# Patient Record
Sex: Female | Born: 1955 | Race: White | Hispanic: No | Marital: Married | Smoking: Never smoker
Health system: Southern US, Community
[De-identification: ages and names within clinical notes are randomized; demographics above are authoritative.]

## PROBLEM LIST (undated history)

## (undated) DIAGNOSIS — E538 Deficiency of other specified B group vitamins: Secondary | ICD-10-CM

## (undated) DIAGNOSIS — K219 Gastro-esophageal reflux disease without esophagitis: Secondary | ICD-10-CM

## (undated) DIAGNOSIS — H269 Unspecified cataract: Secondary | ICD-10-CM

## (undated) DIAGNOSIS — R112 Nausea with vomiting, unspecified: Secondary | ICD-10-CM

## (undated) DIAGNOSIS — E871 Hypo-osmolality and hyponatremia: Secondary | ICD-10-CM

## (undated) DIAGNOSIS — C4490 Unspecified malignant neoplasm of skin, unspecified: Secondary | ICD-10-CM

## (undated) DIAGNOSIS — R2689 Other abnormalities of gait and mobility: Secondary | ICD-10-CM

## (undated) DIAGNOSIS — E2740 Unspecified adrenocortical insufficiency: Secondary | ICD-10-CM

## (undated) DIAGNOSIS — M1990 Unspecified osteoarthritis, unspecified site: Secondary | ICD-10-CM

## (undated) DIAGNOSIS — F4321 Adjustment disorder with depressed mood: Secondary | ICD-10-CM

## (undated) DIAGNOSIS — Z9889 Other specified postprocedural states: Secondary | ICD-10-CM

## (undated) HISTORY — DX: Unspecified adrenocortical insufficiency: E27.40

## (undated) HISTORY — DX: Unspecified cataract: H26.9

## (undated) HISTORY — DX: Unspecified osteoarthritis, unspecified site: M19.90

## (undated) HISTORY — PX: EYE SURGERY: SHX253

## (undated) HISTORY — DX: Unspecified malignant neoplasm of skin, unspecified: C44.90

## (undated) HISTORY — DX: Adjustment disorder with depressed mood: F43.21

## (undated) HISTORY — DX: Hypo-osmolality and hyponatremia: E87.1

## (undated) HISTORY — PX: WRIST SURGERY: SHX841

## (undated) HISTORY — DX: Gastro-esophageal reflux disease without esophagitis: K21.9

## (undated) HISTORY — DX: Other abnormalities of gait and mobility: R26.89

## (undated) HISTORY — PX: APPENDECTOMY: SHX54

## (undated) HISTORY — PX: CHOLECYSTECTOMY: SHX55

## (undated) HISTORY — DX: Deficiency of other specified B group vitamins: E53.8

## (undated) HISTORY — PX: ABDOMINAL HYSTERECTOMY: SHX81

---

## 1999-03-24 ENCOUNTER — Encounter: Admission: RE | Admit: 1999-03-24 | Discharge: 1999-04-15 | Payer: Self-pay | Admitting: Neurological Surgery

## 1999-12-15 ENCOUNTER — Other Ambulatory Visit: Admission: RE | Admit: 1999-12-15 | Discharge: 1999-12-15 | Payer: Self-pay | Admitting: Obstetrics and Gynecology

## 2000-04-13 ENCOUNTER — Emergency Department (HOSPITAL_COMMUNITY): Admission: EM | Admit: 2000-04-13 | Discharge: 2000-04-13 | Payer: Self-pay | Admitting: Emergency Medicine

## 2000-04-13 ENCOUNTER — Encounter: Payer: Self-pay | Admitting: Emergency Medicine

## 2003-08-16 ENCOUNTER — Ambulatory Visit (HOSPITAL_COMMUNITY): Admission: RE | Admit: 2003-08-16 | Discharge: 2003-08-16 | Payer: Self-pay | Admitting: Orthopedic Surgery

## 2006-02-15 DIAGNOSIS — E871 Hypo-osmolality and hyponatremia: Secondary | ICD-10-CM

## 2006-02-15 DIAGNOSIS — R2689 Other abnormalities of gait and mobility: Secondary | ICD-10-CM

## 2006-02-15 HISTORY — DX: Hypo-osmolality and hyponatremia: E87.1

## 2006-02-15 HISTORY — DX: Other abnormalities of gait and mobility: R26.89

## 2006-04-17 ENCOUNTER — Ambulatory Visit: Payer: Self-pay | Admitting: Vascular Surgery

## 2006-04-17 ENCOUNTER — Emergency Department (HOSPITAL_COMMUNITY): Admission: EM | Admit: 2006-04-17 | Discharge: 2006-04-17 | Payer: Self-pay | Admitting: Emergency Medicine

## 2006-04-26 ENCOUNTER — Ambulatory Visit: Payer: Self-pay | Admitting: Internal Medicine

## 2006-05-26 ENCOUNTER — Ambulatory Visit: Payer: Self-pay | Admitting: Internal Medicine

## 2006-05-26 LAB — CONVERTED CEMR LAB
BUN: 8 mg/dL (ref 6–23)
CO2: 26 meq/L (ref 19–32)
Calcium: 8.8 mg/dL (ref 8.4–10.5)
GFR calc Af Amer: 97 mL/min
GFR calc non Af Amer: 80 mL/min
Potassium: 4 meq/L (ref 3.5–5.1)
Sed Rate: 9 mm/hr (ref 0–25)
TSH: 1.5 microintl units/mL (ref 0.35–5.50)
Uric Acid, Serum: 4.8 mg/dL (ref 2.4–7.0)

## 2006-06-24 ENCOUNTER — Inpatient Hospital Stay (HOSPITAL_COMMUNITY): Admission: AD | Admit: 2006-06-24 | Discharge: 2006-06-29 | Payer: Self-pay | Admitting: Internal Medicine

## 2006-06-24 ENCOUNTER — Ambulatory Visit: Payer: Self-pay | Admitting: Internal Medicine

## 2006-06-29 ENCOUNTER — Ambulatory Visit: Payer: Self-pay | Admitting: Internal Medicine

## 2006-07-06 ENCOUNTER — Ambulatory Visit: Payer: Self-pay | Admitting: Endocrinology

## 2006-07-06 ENCOUNTER — Ambulatory Visit: Payer: Self-pay | Admitting: Internal Medicine

## 2006-07-06 LAB — CONVERTED CEMR LAB
Beta Globulin: 5.4 % (ref 4.7–7.2)
CO2: 27 meq/L (ref 19–32)
Calcium: 9.1 mg/dL (ref 8.4–10.5)
GFR calc Af Amer: 97 mL/min
GFR calc non Af Amer: 80 mL/min
Gamma Globulin: 13.3 % (ref 11.1–18.8)
Glucose, Bld: 93 mg/dL (ref 70–99)
Potassium: 3.5 meq/L (ref 3.5–5.1)
Sodium: 135 meq/L (ref 135–145)

## 2006-07-07 ENCOUNTER — Ambulatory Visit: Payer: Self-pay | Admitting: Gastroenterology

## 2006-07-19 ENCOUNTER — Ambulatory Visit: Payer: Self-pay | Admitting: Endocrinology

## 2006-07-19 LAB — CONVERTED CEMR LAB
Calcium: 9 mg/dL (ref 8.4–10.5)
Chloride: 99 meq/L (ref 96–112)
GFR calc Af Amer: 97 mL/min
GFR calc non Af Amer: 80 mL/min
Glucose, Bld: 92 mg/dL (ref 70–99)

## 2006-08-03 ENCOUNTER — Ambulatory Visit: Payer: Self-pay | Admitting: Endocrinology

## 2006-08-03 LAB — CONVERTED CEMR LAB
ALT: 18 units/L (ref 0–40)
AST: 23 units/L (ref 0–37)
Albumin: 4.5 g/dL (ref 3.5–5.2)
Amylase: 75 units/L (ref 27–131)
Bilirubin, Direct: 0.2 mg/dL (ref 0.0–0.3)
Calcium: 9.6 mg/dL (ref 8.4–10.5)
Chloride: 105 meq/L (ref 96–112)
Cortisol, Plasma: 26.6 ug/dL
GFR calc Af Amer: 113 mL/min
GFR calc non Af Amer: 94 mL/min
Glucose, Bld: 94 mg/dL (ref 70–99)
Sodium: 140 meq/L (ref 135–145)

## 2006-08-05 ENCOUNTER — Ambulatory Visit: Payer: Self-pay

## 2006-08-09 ENCOUNTER — Ambulatory Visit: Payer: Self-pay | Admitting: Internal Medicine

## 2006-09-02 ENCOUNTER — Ambulatory Visit: Payer: Self-pay | Admitting: Internal Medicine

## 2006-09-02 LAB — CONVERTED CEMR LAB
BUN: 5 mg/dL — ABNORMAL LOW (ref 6–23)
Calcium: 8.7 mg/dL (ref 8.4–10.5)
Chloride: 107 meq/L (ref 96–112)
GFR calc Af Amer: 97 mL/min
GFR calc non Af Amer: 80 mL/min
Glucose, Bld: 121 mg/dL — ABNORMAL HIGH (ref 70–99)

## 2006-09-06 ENCOUNTER — Ambulatory Visit: Payer: Self-pay | Admitting: Internal Medicine

## 2006-10-07 ENCOUNTER — Ambulatory Visit: Payer: Self-pay | Admitting: Internal Medicine

## 2006-11-24 ENCOUNTER — Encounter: Payer: Self-pay | Admitting: *Deleted

## 2006-11-24 DIAGNOSIS — E2740 Unspecified adrenocortical insufficiency: Secondary | ICD-10-CM | POA: Insufficient documentation

## 2006-11-24 DIAGNOSIS — E871 Hypo-osmolality and hyponatremia: Secondary | ICD-10-CM | POA: Insufficient documentation

## 2006-11-24 DIAGNOSIS — M109 Gout, unspecified: Secondary | ICD-10-CM | POA: Insufficient documentation

## 2007-01-05 ENCOUNTER — Ambulatory Visit: Payer: Self-pay | Admitting: Internal Medicine

## 2007-01-05 DIAGNOSIS — R5381 Other malaise: Secondary | ICD-10-CM | POA: Insufficient documentation

## 2007-01-05 DIAGNOSIS — R5383 Other fatigue: Secondary | ICD-10-CM | POA: Insufficient documentation

## 2007-01-05 LAB — CONVERTED CEMR LAB
Calcium: 9.3 mg/dL (ref 8.4–10.5)
Chloride: 105 meq/L (ref 96–112)
Creatinine, Ser: 0.8 mg/dL (ref 0.4–1.2)
GFR calc non Af Amer: 80 mL/min
TSH: 1.09 microintl units/mL (ref 0.35–5.50)

## 2007-01-11 ENCOUNTER — Ambulatory Visit: Payer: Self-pay | Admitting: Internal Medicine

## 2007-01-11 DIAGNOSIS — R42 Dizziness and giddiness: Secondary | ICD-10-CM | POA: Insufficient documentation

## 2007-01-11 DIAGNOSIS — K219 Gastro-esophageal reflux disease without esophagitis: Secondary | ICD-10-CM | POA: Insufficient documentation

## 2007-02-07 ENCOUNTER — Telehealth: Payer: Self-pay | Admitting: Internal Medicine

## 2007-02-08 ENCOUNTER — Ambulatory Visit: Payer: Self-pay | Admitting: Internal Medicine

## 2007-02-08 DIAGNOSIS — J209 Acute bronchitis, unspecified: Secondary | ICD-10-CM | POA: Insufficient documentation

## 2007-02-16 DIAGNOSIS — E538 Deficiency of other specified B group vitamins: Secondary | ICD-10-CM

## 2007-02-16 HISTORY — DX: Deficiency of other specified B group vitamins: E53.8

## 2007-04-07 ENCOUNTER — Ambulatory Visit: Payer: Self-pay | Admitting: Internal Medicine

## 2007-04-08 LAB — CONVERTED CEMR LAB
BUN: 5 mg/dL — ABNORMAL LOW (ref 6–23)
Calcium: 8.9 mg/dL (ref 8.4–10.5)
Chloride: 106 meq/L (ref 96–112)
Creatinine, Ser: 0.8 mg/dL (ref 0.4–1.2)
GFR calc non Af Amer: 80 mL/min
TSH: 1.06 microintl units/mL (ref 0.35–5.50)

## 2007-04-25 ENCOUNTER — Ambulatory Visit: Payer: Self-pay | Admitting: Internal Medicine

## 2007-04-25 DIAGNOSIS — M25569 Pain in unspecified knee: Secondary | ICD-10-CM | POA: Insufficient documentation

## 2007-04-25 DIAGNOSIS — R21 Rash and other nonspecific skin eruption: Secondary | ICD-10-CM | POA: Insufficient documentation

## 2007-04-25 DIAGNOSIS — R079 Chest pain, unspecified: Secondary | ICD-10-CM | POA: Insufficient documentation

## 2007-04-25 DIAGNOSIS — M1990 Unspecified osteoarthritis, unspecified site: Secondary | ICD-10-CM | POA: Insufficient documentation

## 2007-04-27 ENCOUNTER — Encounter: Payer: Self-pay | Admitting: Internal Medicine

## 2007-04-28 ENCOUNTER — Ambulatory Visit: Payer: Self-pay

## 2007-05-08 ENCOUNTER — Encounter: Payer: Self-pay | Admitting: Internal Medicine

## 2007-05-15 ENCOUNTER — Ambulatory Visit (HOSPITAL_BASED_OUTPATIENT_CLINIC_OR_DEPARTMENT_OTHER): Admission: RE | Admit: 2007-05-15 | Discharge: 2007-05-15 | Payer: Self-pay | Admitting: Orthopedic Surgery

## 2007-06-12 ENCOUNTER — Telehealth: Payer: Self-pay | Admitting: Internal Medicine

## 2007-07-21 ENCOUNTER — Telehealth: Payer: Self-pay | Admitting: Internal Medicine

## 2007-07-21 ENCOUNTER — Ambulatory Visit: Payer: Self-pay | Admitting: Internal Medicine

## 2007-07-27 ENCOUNTER — Telehealth: Payer: Self-pay | Admitting: Internal Medicine

## 2007-07-27 DIAGNOSIS — R930 Abnormal findings on diagnostic imaging of skull and head, not elsewhere classified: Secondary | ICD-10-CM | POA: Insufficient documentation

## 2007-08-15 ENCOUNTER — Ambulatory Visit: Payer: Self-pay | Admitting: Cardiology

## 2007-10-20 ENCOUNTER — Ambulatory Visit: Payer: Self-pay | Admitting: Internal Medicine

## 2007-10-25 ENCOUNTER — Ambulatory Visit: Payer: Self-pay | Admitting: Internal Medicine

## 2007-10-25 LAB — CONVERTED CEMR LAB
CO2: 29 meq/L (ref 19–32)
Chloride: 105 meq/L (ref 96–112)
Creatinine, Ser: 0.7 mg/dL (ref 0.4–1.2)
GFR calc non Af Amer: 93 mL/min
Potassium: 3.8 meq/L (ref 3.5–5.1)
Sodium: 138 meq/L (ref 135–145)
TSH: 1.14 microintl units/mL (ref 0.35–5.50)

## 2007-11-06 ENCOUNTER — Telehealth: Payer: Self-pay | Admitting: Internal Medicine

## 2007-11-14 ENCOUNTER — Ambulatory Visit: Payer: Self-pay | Admitting: Internal Medicine

## 2008-02-01 ENCOUNTER — Ambulatory Visit: Payer: Self-pay | Admitting: Internal Medicine

## 2008-02-06 ENCOUNTER — Ambulatory Visit: Payer: Self-pay | Admitting: Internal Medicine

## 2008-02-06 DIAGNOSIS — I9589 Other hypotension: Secondary | ICD-10-CM | POA: Insufficient documentation

## 2008-02-06 LAB — CONVERTED CEMR LAB
Chloride: 104 meq/L (ref 96–112)
Creatinine, Ser: 0.7 mg/dL (ref 0.4–1.2)
GFR calc non Af Amer: 93 mL/min
Sodium: 136 meq/L (ref 135–145)
Vitamin B-12: 287 pg/mL (ref 211–911)

## 2008-02-07 ENCOUNTER — Telehealth (INDEPENDENT_AMBULATORY_CARE_PROVIDER_SITE_OTHER): Payer: Self-pay | Admitting: *Deleted

## 2008-02-12 ENCOUNTER — Telehealth: Payer: Self-pay | Admitting: Internal Medicine

## 2008-04-30 ENCOUNTER — Ambulatory Visit: Payer: Self-pay

## 2008-04-30 ENCOUNTER — Encounter: Payer: Self-pay | Admitting: Internal Medicine

## 2008-05-30 ENCOUNTER — Ambulatory Visit: Payer: Self-pay | Admitting: Internal Medicine

## 2008-05-30 LAB — CONVERTED CEMR LAB
Calcium: 9.1 mg/dL (ref 8.4–10.5)
Chloride: 100 meq/L (ref 96–112)
Creatinine, Ser: 0.8 mg/dL (ref 0.4–1.2)
Sodium: 136 meq/L (ref 135–145)
TSH: 1.39 microintl units/mL (ref 0.35–5.50)
Vit D, 25-Hydroxy: 40 ng/mL (ref 30–89)
Vitamin B-12: 491 pg/mL (ref 211–911)

## 2008-06-04 ENCOUNTER — Ambulatory Visit: Payer: Self-pay | Admitting: Internal Medicine

## 2008-06-04 DIAGNOSIS — R1011 Right upper quadrant pain: Secondary | ICD-10-CM | POA: Insufficient documentation

## 2008-06-05 LAB — CONVERTED CEMR LAB
ALT: 19 units/L (ref 0–35)
AST: 24 units/L (ref 0–37)
Albumin: 4.7 g/dL (ref 3.5–5.2)
Bilirubin Urine: NEGATIVE
Hemoglobin, Urine: NEGATIVE
Ketones, ur: NEGATIVE mg/dL
Leukocytes, UA: NEGATIVE
Lymphocytes Relative: 22.9 % (ref 12.0–46.0)
MCHC: 33.6 g/dL (ref 30.0–36.0)
Neutrophils Relative %: 67 % (ref 43.0–77.0)
RBC: 4.87 M/uL (ref 3.87–5.11)
RDW: 11.9 % (ref 11.5–14.6)
Sed Rate: 7 mm/hr (ref 0–22)
Urobilinogen, UA: 0.2 (ref 0.0–1.0)

## 2008-06-11 ENCOUNTER — Encounter: Admission: RE | Admit: 2008-06-11 | Discharge: 2008-06-11 | Payer: Self-pay | Admitting: Internal Medicine

## 2008-09-03 ENCOUNTER — Ambulatory Visit: Payer: Self-pay | Admitting: Internal Medicine

## 2008-09-05 LAB — CONVERTED CEMR LAB
BUN: 9 mg/dL (ref 6–23)
Creatinine, Ser: 0.8 mg/dL (ref 0.4–1.2)
Eosinophils Relative: 2 % (ref 0.0–5.0)
GFR calc non Af Amer: 79.55 mL/min (ref >60)
MCV: 89.2 fL (ref 78.0–100.0)
Monocytes Absolute: 0.7 10*3/uL (ref 0.1–1.0)
Monocytes Relative: 6.6 % (ref 3.0–12.0)
Neutrophils Relative %: 69 % (ref 43.0–77.0)
Platelets: 249 10*3/uL (ref 150.0–400.0)
WBC: 10.5 10*3/uL (ref 4.5–10.5)

## 2008-12-31 ENCOUNTER — Ambulatory Visit: Payer: Self-pay | Admitting: Internal Medicine

## 2009-02-15 DIAGNOSIS — C4490 Unspecified malignant neoplasm of skin, unspecified: Secondary | ICD-10-CM

## 2009-02-15 HISTORY — PX: TARSAL TUNNEL RELEASE: SUR1099

## 2009-02-15 HISTORY — DX: Unspecified malignant neoplasm of skin, unspecified: C44.90

## 2009-02-21 ENCOUNTER — Ambulatory Visit: Payer: Self-pay | Admitting: Internal Medicine

## 2009-02-21 DIAGNOSIS — D485 Neoplasm of uncertain behavior of skin: Secondary | ICD-10-CM | POA: Insufficient documentation

## 2009-02-28 ENCOUNTER — Telehealth: Payer: Self-pay | Admitting: Internal Medicine

## 2009-03-31 ENCOUNTER — Encounter: Payer: Self-pay | Admitting: Internal Medicine

## 2009-05-02 ENCOUNTER — Ambulatory Visit: Payer: Self-pay | Admitting: Internal Medicine

## 2009-05-04 LAB — CONVERTED CEMR LAB
AST: 21 units/L (ref 0–37)
Albumin: 4 g/dL (ref 3.5–5.2)
Alkaline Phosphatase: 58 units/L (ref 39–117)
Basophils Absolute: 0 10*3/uL (ref 0.0–0.1)
Bilirubin, Direct: 0.2 mg/dL (ref 0.0–0.3)
Eosinophils Absolute: 0.4 10*3/uL (ref 0.0–0.7)
GFR calc non Af Amer: 79.35 mL/min (ref >60)
Glucose, Bld: 88 mg/dL (ref 70–99)
HCT: 43.8 % (ref 36.0–46.0)
Hemoglobin: 14.2 g/dL (ref 12.0–15.0)
Lymphs Abs: 2.5 10*3/uL (ref 0.7–4.0)
MCHC: 32.3 g/dL (ref 30.0–36.0)
MCV: 95.3 fL (ref 78.0–100.0)
Monocytes Absolute: 0.6 10*3/uL (ref 0.1–1.0)
Neutro Abs: 3.2 10*3/uL (ref 1.4–7.7)
Potassium: 4.1 meq/L (ref 3.5–5.1)
RDW: 11.9 % (ref 11.5–14.6)
Sodium: 138 meq/L (ref 135–145)
Total Bilirubin: 0.6 mg/dL (ref 0.3–1.2)

## 2009-05-07 ENCOUNTER — Ambulatory Visit: Payer: Self-pay | Admitting: Internal Medicine

## 2009-05-07 DIAGNOSIS — C4490 Unspecified malignant neoplasm of skin, unspecified: Secondary | ICD-10-CM | POA: Insufficient documentation

## 2009-05-21 ENCOUNTER — Telehealth: Payer: Self-pay | Admitting: Internal Medicine

## 2009-05-28 ENCOUNTER — Telehealth: Payer: Self-pay | Admitting: Internal Medicine

## 2009-05-30 ENCOUNTER — Encounter: Payer: Self-pay | Admitting: Internal Medicine

## 2009-05-30 HISTORY — PX: OTHER SURGICAL HISTORY: SHX169

## 2009-06-02 ENCOUNTER — Encounter: Payer: Self-pay | Admitting: Internal Medicine

## 2009-07-03 ENCOUNTER — Emergency Department (HOSPITAL_COMMUNITY): Admission: EM | Admit: 2009-07-03 | Discharge: 2009-07-04 | Payer: Self-pay | Admitting: Emergency Medicine

## 2009-07-04 ENCOUNTER — Telehealth: Payer: Self-pay | Admitting: Internal Medicine

## 2009-07-08 ENCOUNTER — Telehealth: Payer: Self-pay | Admitting: Internal Medicine

## 2009-07-09 ENCOUNTER — Ambulatory Visit: Payer: Self-pay | Admitting: Internal Medicine

## 2009-07-09 DIAGNOSIS — R509 Fever, unspecified: Secondary | ICD-10-CM | POA: Insufficient documentation

## 2009-07-09 DIAGNOSIS — R1110 Vomiting, unspecified: Secondary | ICD-10-CM | POA: Insufficient documentation

## 2009-07-09 DIAGNOSIS — M79609 Pain in unspecified limb: Secondary | ICD-10-CM | POA: Insufficient documentation

## 2009-07-10 LAB — CONVERTED CEMR LAB
ALT: 45 units/L — ABNORMAL HIGH (ref 0–35)
Alkaline Phosphatase: 56 units/L (ref 39–117)
Basophils Relative: 0 % (ref 0.0–3.0)
Bilirubin, Direct: 0.1 mg/dL (ref 0.0–0.3)
Calcium: 9.4 mg/dL (ref 8.4–10.5)
Creatinine, Ser: 0.8 mg/dL (ref 0.4–1.2)
Eosinophils Absolute: 0.1 10*3/uL (ref 0.0–0.7)
Eosinophils Relative: 0.6 % (ref 0.0–5.0)
Ketones, ur: NEGATIVE mg/dL
Leukocytes, UA: NEGATIVE
Lymphocytes Relative: 15.7 % (ref 12.0–46.0)
Neutrophils Relative %: 80.4 % — ABNORMAL HIGH (ref 43.0–77.0)
Platelets: 311 10*3/uL (ref 150.0–400.0)
RBC: 4.73 M/uL (ref 3.87–5.11)
Specific Gravity, Urine: <=1.005 (ref 1.000–1.030)
TSH: 0.69 microintl units/mL (ref 0.35–5.50)
Total Bilirubin: 0.5 mg/dL (ref 0.3–1.2)
Total Protein: 7.2 g/dL (ref 6.0–8.3)
Urobilinogen, UA: 0.2 (ref 0.0–1.0)
WBC: 10.2 10*3/uL (ref 4.5–10.5)
pH: 5 (ref 5.0–8.0)

## 2009-07-21 ENCOUNTER — Telehealth: Payer: Self-pay | Admitting: Internal Medicine

## 2009-09-02 ENCOUNTER — Ambulatory Visit: Payer: Self-pay | Admitting: Internal Medicine

## 2009-09-02 LAB — CONVERTED CEMR LAB
BUN: 9 mg/dL (ref 6–23)
Chloride: 101 meq/L (ref 96–112)
GFR calc non Af Amer: 88.09 mL/min (ref >60)
Potassium: 4.6 meq/L (ref 3.5–5.1)
Sodium: 135 meq/L (ref 135–145)

## 2009-09-09 ENCOUNTER — Ambulatory Visit: Payer: Self-pay | Admitting: Internal Medicine

## 2009-09-09 DIAGNOSIS — L570 Actinic keratosis: Secondary | ICD-10-CM | POA: Insufficient documentation

## 2009-09-09 DIAGNOSIS — M25579 Pain in unspecified ankle and joints of unspecified foot: Secondary | ICD-10-CM | POA: Insufficient documentation

## 2009-09-18 ENCOUNTER — Encounter: Payer: Self-pay | Admitting: Internal Medicine

## 2009-11-04 ENCOUNTER — Encounter: Payer: Self-pay | Admitting: Internal Medicine

## 2009-11-24 ENCOUNTER — Encounter: Payer: Self-pay | Admitting: Internal Medicine

## 2009-12-19 ENCOUNTER — Telehealth: Payer: Self-pay | Admitting: Internal Medicine

## 2010-01-01 ENCOUNTER — Encounter: Payer: Self-pay | Admitting: Internal Medicine

## 2010-01-07 ENCOUNTER — Ambulatory Visit: Payer: Self-pay | Admitting: Internal Medicine

## 2010-01-11 LAB — CONVERTED CEMR LAB
ALT: 13 units/L (ref 0–35)
AST: 13 units/L (ref 0–37)
BUN: 11 mg/dL (ref 6–23)
Basophils Absolute: 0.1 10*3/uL (ref 0.0–0.1)
Bilirubin, Direct: 0.1 mg/dL (ref 0.0–0.3)
Calcium: 9 mg/dL (ref 8.4–10.5)
Cholesterol: 197 mg/dL (ref 0–200)
Creatinine, Ser: 0.9 mg/dL (ref 0.4–1.2)
Eosinophils Relative: 2.7 % (ref 0.0–5.0)
GFR calc non Af Amer: 71.85 mL/min (ref >60)
Glucose, Bld: 90 mg/dL (ref 70–99)
HCT: 43.1 % (ref 36.0–46.0)
HDL: 89.9 mg/dL (ref >39.00)
Ketones, ur: NEGATIVE mg/dL
LDL Cholesterol: 83 mg/dL (ref 0–99)
Leukocytes, UA: NEGATIVE
Lymphs Abs: 4.1 10*3/uL — ABNORMAL HIGH (ref 0.7–4.0)
Monocytes Relative: 8.5 % (ref 3.0–12.0)
Neutrophils Relative %: 45.8 % (ref 43.0–77.0)
Nitrite: NEGATIVE
Platelets: 278 10*3/uL (ref 150.0–400.0)
RDW: 13.7 % (ref 11.5–14.6)
Specific Gravity, Urine: 1.015 (ref 1.000–1.030)
TSH: 2.17 microintl units/mL (ref 0.35–5.50)
Total Bilirubin: 0.7 mg/dL (ref 0.3–1.2)
Total Protein, Urine: NEGATIVE mg/dL
Triglycerides: 123 mg/dL (ref 0.0–149.0)
VLDL: 24.6 mg/dL (ref 0.0–40.0)
WBC: 9.6 10*3/uL (ref 4.5–10.5)
pH: 6 (ref 5.0–8.0)

## 2010-01-13 ENCOUNTER — Ambulatory Visit: Payer: Self-pay | Admitting: Internal Medicine

## 2010-01-13 ENCOUNTER — Encounter: Payer: Self-pay | Admitting: Internal Medicine

## 2010-01-13 DIAGNOSIS — E538 Deficiency of other specified B group vitamins: Secondary | ICD-10-CM | POA: Insufficient documentation

## 2010-03-19 NOTE — Progress Notes (Signed)
Summary: OV  Phone Note Other Incoming   Caller: pt Summary of Call: Pt states that she is severly nauseated. she states that she was bitten by a brown recluse spider about one month ago. she has a tick on her chest about 2 weeks ago and doesn't know if any of these things would have connection to her nausea. What do you advise? Initial call taken by: Ami Bullins CMA,  Jul 08, 2009 10:42 AM  Follow-up for Phone Call        Likely not. OVif sick. Call in Phenergan Follow-up by: Tresa Garter MD,  Jul 08, 2009 5:50 PM  Additional Follow-up for Phone Call Additional follow up Details #1::        Spoke w/pt's husband, pt has fever and continues to c/o nausea. Already has phenergan at home which is giving little relief. Scheduled for office visit today. Additional Follow-up by: Lamar Sprinkles, CMA,  Jul 09, 2009 8:35 AM    New/Updated Medications: PROMETHAZINE HCL 25 MG TABS (PROMETHAZINE HCL) 1-2 by mouth four times a day as needed nausea Prescriptions: PROMETHAZINE HCL 25 MG TABS (PROMETHAZINE HCL) 1-2 by mouth four times a day as needed nausea  #60 x 1   Entered and Authorized by:   Tresa Garter MD   Signed by:   Lamar Sprinkles, CMA on 07/09/2009   Method used:   Electronically to        [facility]* (retail)       [address] [place], [place]  [address]       Ph: [phone] or [phone]       Fax: [phone]   RxID:   [id number]

## 2010-03-19 NOTE — Medication Information (Signed)
Summary: Clarification for Prevacid/[facility]  Clarification for Prevacid/[facility]   Imported By: Sherian Rein 06/04/2009 13:14:01  _____________________________________________________________________  External Attachment:    Type:   Image     Comment:   External Document

## 2010-03-19 NOTE — Assessment & Plan Note (Signed)
Summary: 4 mos f/u #/cd  RS'D PER PT/NWS   Vital Signs:  Patient profile:   55 year old female Weight:      165 pounds BMI:     26.73 Temp:     98.2 degrees F oral Pulse rate:   91 / minute BP sitting:   122 / 90  (left arm)  Vitals Entered By: Tora Perches (May 07, 2009 1:53 PM) CC: f/u Is Patient Diabetic? No   CC:  f/u.  History of Present Illness: The patient presents for a follow up of GERD, back pain, anxiety,  IBS, hypokalemia.    Preventive Screening-Counseling & Management  Alcohol-Tobacco     Smoking Status: never  Current Medications (verified): 1)  Prevacid 30 Mg  Cpdr (Lansoprazole) .... Take 1 By Mouth Two Times A Day Qd 2)  Vitamin D3 1000 Unit  Tabs (Cholecalciferol) .Marland Kitchen.. 1 Qd 3)  Klor-Con M20 20 Meq  Tbcr (Potassium Chloride Crys Cr) .Marland Kitchen.. 1 By Mouth Qd 4)  Vitamin B-12 Cr 1000 Mcg  Tbcr (Cyanocobalamin) .... Take One Tablet By Mouth Daily 5)  Celebrex 100 Mg Caps (Celecoxib) .... Once Daily As Needed 6)  Librax 2.5-5 Mg  Caps (Clidinium-Chlordiazepoxide) .... Take 1 Tab Four Times A Day As Needed 7)  Cortef 10 Mg  Tabs (Hydrocortisone) .... Take 1 By Mouth Q Am 8)  Cortef 5 Mg Tabs (Hydrocortisone) .Marland Kitchen.. 1 By Mouth Q Lunch  Allergies: 1)  ! Doxycycline 2)  ! Codeine 3)  ! Hydrocodone  Past History:  Past Medical History: Gout Hyponatremia 2006/07/19 Balance problem  07/19/2006, likely central Hypotension with poss. adrenal insuff.     ? viral encephalitis vs Lyme 2006-07-19 GERD Grief, son died in Jul 18, 2005  Dr Wyline Mood Osteoarthritis L knee Borderline low Vit B12 Jul 19, 2007 Skin Ca Jul 18, 2009  Physical Exam  General:  Well-developed,well-nourished,in no acute distress; alert,appropriate and cooperative throughout examination Mouth:  Oral mucosa and oropharynx without lesions or exudates.  Teeth in good repair. Lungs:  Normal respiratory effort, chest expands symmetrically. Lungs are clear to auscultation, no crackles or wheezes. Heart:  Normal rate and regular rhythm. S1  and S2 normal without gallop, murmur, click, rub or other extra sounds. Abdomen:  soft, no distention, no masses, no guarding, and RUQ tenderness.   Msk:  No LS Spine pain Extremities:  No clubbing, cyanosis, edema, or deformity noted with normal full range of motion of all joints.   Neurologic:  No cranial nerve deficits noted. Station and gait are normal. Plantar reflexes are down-going bilaterally. DTRs are symmetrical throughout. Sensory, motor and coordinative functions appear intact. Skin:  scars post-op on chest and back Psych:  Cognition and judgment appear intact. Alert and cooperative with normal attention span and concentration. No apparent delusions, illusions, hallucinations   Impression & Recommendations:  Problem # 1:  HYPOTENSION, CHRONIC (ICD-458.1) Assessment Improved On prescription/OTC  therapy   Problem # 2:  RASH AND OTHER NONSPECIFIC SKIN ERUPTION (ICD-782.1) Assessment: Improved  Problem # 3:  HYPONATREMIA (ICD-276.1) Assessment: Improved  Problem # 4:  DYSEQUILIBRIUM (ICD-780.4) Assessment: Improved  Problem # 5:  NEOPLASM, SKIN, BASAL CELL, CARCINOMA (ICD-173.9) Assessment: Comment Only S/p removal  Problem # 6:  KNEE PAIN (ICD-719.46) Assessment: Unchanged  Her updated medication list for this problem includes:    Celebrex 100 Mg Caps (Celecoxib) ..... Once daily as needed  Complete Medication List: 1)  Prevacid 30 Mg Cpdr (Lansoprazole) .... Take 1 by mouth two times a day qd 2)  Vitamin  D3 1000 Unit Tabs (Cholecalciferol) .Marland Kitchen.. 1 qd 3)  Klor-con M20 20 Meq Tbcr (Potassium chloride crys cr) .Marland Kitchen.. 1 by mouth qd 4)  Vitamin B-12 Cr 1000 Mcg Tbcr (Cyanocobalamin) .... Take one tablet by mouth daily 5)  Celebrex 100 Mg Caps (Celecoxib) .... Once daily as needed 6)  Librax 2.5-5 Mg Caps (Clidinium-chlordiazepoxide) .... Take 1 tab four times a day as needed 7)  Cortef 10 Mg Tabs (Hydrocortisone) .... Take 1 by mouth q am 8)  Cortef 5 Mg Tabs  (Hydrocortisone) .Marland Kitchen.. 1 by mouth q lunch  Patient Instructions: 1)  Please schedule a follow-up appointment in 4 months. 2)  BMP prior to visit, ICD-9:995.20 Prescriptions: PREVACID 30 MG  CPDR (LANSOPRAZOLE) take 1 by mouth two times a day qd  #90 x 3   Entered and Authorized by:   Tresa Garter MD   Signed by:   Tresa Garter MD on 05/07/2009   Method used:   Print then Give to Patient   RxID:   [id number]

## 2010-03-19 NOTE — Progress Notes (Signed)
Summary: REFILL  Phone Note Call from Patient   Summary of Call: Patient is requesting refill of generic ambien 10mg  1 at bedtime. OK?  Initial call taken by: Lamar Sprinkles, CMA,  July 21, 2009 8:55 AM  Follow-up for Phone Call        ok 3 ref Follow-up by: Tresa Garter MD,  July 21, 2009 1:01 PM    New/Updated Medications: ZOLPIDEM TARTRATE 10 MG TABS (ZOLPIDEM TARTRATE) 1 by mouth at bedtime as needed Prescriptions: ZOLPIDEM TARTRATE 10 MG TABS (ZOLPIDEM TARTRATE) 1 by mouth at bedtime as needed  #30 x 3   Entered by:   Ami Bullins CMA   Authorized by:   Tresa Garter MD   Signed by:   Bill Salinas CMA on 07/21/2009   Method used:   Telephoned to ...       [facility]* (retail)       [address] [place], [place]  [address]       Ph: [phone] or [phone]       Fax: [phone]   RxID:   [id number]   Appended Document: REFILL Called in again, per pharm they lost order

## 2010-03-19 NOTE — Letter (Signed)
Summary: [facility]   Imported By: Lester Lompico 06/04/2009 12:24:19  _____________________________________________________________________  External Attachment:    Type:   Image     Comment:   External Document

## 2010-03-19 NOTE — Assessment & Plan Note (Signed)
Summary: 4 MO ROV /NWS  #   Vital Signs:  Patient profile:   55 year old female Height:      66 inches O2 Sat:      98 % on Room air Temp:     98.1 degrees F oral Pulse rate:   86 / minute Pulse rhythm:   regular Resp:     16 per minute BP sitting:   140 / 88  (left arm) Cuff size:   regular  Vitals Entered By: Lanier Prude, CMA(AAMA) (September 09, 2009 1:20 PM)  O2 Flow:  Room air CC: 4 mo f/u Is Patient Diabetic? No Comments pt is not taking Vit D, Klor Con, Vit b12, Celebrex or Librax.   CC:  4 mo f/u.  History of Present Illness: The patient presents for a follow up of hyportension, OA, ataxia, adrenal issues, hand swelling   Current Medications (verified): 1)  Vitamin D3 1000 Unit  Tabs (Cholecalciferol) .Marland Kitchen.. 1 Qd 2)  Klor-Con M20 20 Meq  Tbcr (Potassium Chloride Crys Cr) .Marland Kitchen.. 1 By Mouth Qd 3)  Vitamin B-12 Cr 1000 Mcg  Tbcr (Cyanocobalamin) .... Take One Tablet By Mouth Daily 4)  Celebrex 100 Mg Caps (Celecoxib) .... Once Daily As Needed 5)  Librax 2.5-5 Mg  Caps (Clidinium-Chlordiazepoxide) .... Take 1 Tab Four Times A Day As Needed 6)  Cortef 10 Mg  Tabs (Hydrocortisone) .... Take 1 By Mouth Q Am 7)  Cortef 5 Mg Tabs (Hydrocortisone) .Marland Kitchen.. 1 By Mouth Q Lunch 8)  Promethazine Hcl 25 Mg Tabs (Promethazine Hcl) .Marland Kitchen.. 1-2 By Mouth Four Times A Day As Needed Nausea 9)  Zolpidem Tartrate 10 Mg Tabs (Zolpidem Tartrate) .Marland Kitchen.. 1 By Mouth At Bedtime As Needed 10)  Neurontin 100 Mg Caps (Gabapentin) .Marland Kitchen.. 1 Three Times A Day 11)  Keflex 500 Mg Caps (Cephalexin) .Marland Kitchen.. 1 Four Times Daily  Allergies (verified): 1)  ! Doxycycline 2)  ! Codeine 3)  ! Hydrocodone  Past History:  Past Medical History: Last updated: 05/07/2009 Gout Hyponatremia 2006-07-20 Balance problem  Jul 20, 2006, likely central Hypotension with poss. adrenal insuff.     ? 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    Physical Exam  General:  Well-developed,well-nourished,in no acute distress; alert,appropriate and cooperative throughout examination Ears:  External ear exam shows no significant lesions or deformities.  Otoscopic examination reveals clear canals, tympanic membranes are intact bilaterally without bulging, retraction, inflammation or discharge. Hearing is grossly normal bilaterally. Nose:  External nasal examination shows no deformity or inflammation. Nasal mucosa are pink and moist without lesions or exudates. Mouth:  Oral mucosa and oropharynx without lesions or exudates.  Teeth in good repair. Neck:  No deformities, masses, or tenderness noted. Lungs:  Normal respiratory effort, chest expands symmetrically. Lungs are clear to auscultation, no crackles or wheezes. Heart:  Normal rate and regular rhythm. S1 and S2 normal without gallop, murmur, click, rub or other extra sounds. Abdomen:  Bowel sounds positive,abdomen soft and non-tender without masses, organomegaly or hernias noted. Msk:  No deformity  or scoliosis noted of thoracic or lumbar spine.   2nd L MCP dorsal aspect with much less swelling - not hot, non tender w/deep palpation Extremities:  No clubbing, cyanosis, edema, or deformity noted with normal full range of motion of all joints.   Neurologic:  No cranial nerve deficits noted. Station and gait are normal. Plantar reflexes are down-going bilaterally. DTRs are symmetrical throughout. Sensory, motor and coordinative functions appear intact. Skin:  scars post-op on chest and back, 2nd MCP L L foot 4  mm ?AK Psych:  Cognition and judgment appear intact. Alert and cooperative with normal attention span and concentration. No apparent delusions, illusions, hallucinations   Impression & Recommendations:  Problem # 1:  HYPOTENSION, CHRONIC (ICD-458.1) Assessment Improved On Rx  Problem # 2:  HYPONATREMIA (ICD-276.1) Assessment: Improved  Problem # 3:  ANKLE PAIN (ICD-719.47) R post-op Assessment: New Elevate leg  Problem # 4:  HAND PAIN (ICD-729.5) L  Assessment: Improved  Problem # 5:  ACTINIC KERATOSIS (ICD-702.0) Assessment: New  Procedure: cryo Indication: AK(s) L foot Risks incl. scar(s), incomplete removal, ect.  and benefits discussed      lesion(s) on was/were treated with liqid N2 in usual fasion.  Tolerated well. Compl. none. Wound care instructions given.   Orders: Cryotherapy/Destruction benign or premalignant lesion (1st lesion)  (17000)  Complete Medication List: 1)  Vitamin D3 1000 Unit Tabs (Cholecalciferol) .Marland Kitchen.. 1 qd 2)  Klor-con M20 20 Meq Tbcr (Potassium chloride crys cr) .Marland Kitchen.. 1 by mouth qd 3)  Vitamin B-12 Cr 1000 Mcg Tbcr (Cyanocobalamin) .... Take one tablet by mouth daily 4)  Celebrex 100 Mg Caps (Celecoxib) .... Once daily as needed 5)  Librax 2.5-5 Mg Caps (Clidinium-chlordiazepoxide) .... Take 1 tab four times a day as needed 6)  Cortef 10 Mg Tabs (Hydrocortisone) .... Take 1 by mouth q am 7)  Cortef 5 Mg Tabs (Hydrocortisone) .Marland Kitchen.. 1 by mouth q lunch 8)  Promethazine Hcl 25 Mg Tabs (Promethazine hcl) .Marland Kitchen.. 1-2 by mouth four times a day as needed nausea 9)  Zolpidem Tartrate 10 Mg Tabs (Zolpidem tartrate) .Marland Kitchen.. 1 by mouth at bedtime as needed 10)  Neurontin 100 Mg Caps (Gabapentin) .Marland Kitchen.. 1 three times a day 11)  Keflex 500 Mg Caps (Cephalexin) .Marland Kitchen.. 1 four times daily  Patient Instructions: 1)  Please schedule a follow-up appointment in 4 months well w/labs v70.0. 2)  B12 995.20 Prescriptions: CORTEF 5 MG TABS (HYDROCORTISONE) 1 by mouth q lunch   #90 x 3   Entered and Authorized by:   Tresa Garter MD   Signed by:   Tresa Garter MD on 09/09/2009   Method used:   Faxed to ...       [facility] (mail[facility]order)       [address] [place], [place]  [address]       Ph: [phone]       Fax: [phone]   RxID:   [id number]

## 2010-03-19 NOTE — Assessment & Plan Note (Signed)
Summary: SORE PLACE AREA R SIDE OF CHEST-PER VG OK--STC   Vital Signs:  Patient profile:   55 year old female Temp:     98.6 degrees F oral Pulse rate:   126 / minute BP sitting:   146 / 80  (left arm)  Vitals Entered By: Tora Perches (February 21, 2009 3:42 PM)  Procedure Note  Mole Biopsy/Removal: The patient complains of irritation and swelling. Indication: suspicious lesion Consent signed: yes  Procedure # 1: shave biopsy    Size (in cm): 0.9 x 0.6    Region: lateral    Location: R upper chest wall    Comment: Risks including but not limited by incomplete procedure, bleeding, infection, recurrence were discussed with the patient. Consent form was signed.     Instrument used: dermablade    Anesthesia: 1.0 ml 1% lidocaine w/epinephrine  Cleaned and prepped with: alcohol and betadine Wound dressing: neosporin and bandaid Additional Instructions: Tol well. Compl. none  CC: place on right side of chest Is Patient Diabetic? No Comments pt refused to weigh/vg   CC:  place on right side of chest.  History of Present Illness: C/o a spot on chest x 4 mo   Preventive Screening-Counseling & Management  Alcohol-Tobacco     Smoking Status: never  Allergies: 1)  ! Doxycycline 2)  ! Codeine 3)  ! Hydrocodone  Past History:  Past Medical History: Last updated: 02/06/2008 Gout Hyponatremia 2006/07/15 Balance problem  2006-07-15, likely central Hypotension with poss. adrenal insuff.     ? 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 Lungs are clear to auscultation, no crackles or wheezes. Heart:  Normal rate and regular rhythm. S1 and S2 normal without gallop, murmur, click, rub or other extra sounds. Skin:  Lesion on R chest wall w/escar   Impression & Recommendations:  Problem # 1:  NEOPLASM OF UNCERTAIN BEHAVIOR OF SKIN (ICD-238.2) Assessment New  Options discussed. She decided to have a bx  Orders: Shave Skin Lesion 0.6-1.0 cm/trunk/arm/leg (11301)  Complete Medication List: 1)  Prevacid 30 Mg Cpdr (Lansoprazole) .... Take 1 by mouth two times a day qd 2)  Vitamin D3 1000 Unit Tabs (Cholecalciferol) .Marland Kitchen.. 1 qd 3)  Klor-con M20 20 Meq Tbcr (Potassium chloride crys cr) .Marland Kitchen.. 1 by mouth qd 4)  Vitamin B-12 Cr 1000 Mcg Tbcr (Cyanocobalamin) .... Take one tablet by mouth daily 5)  Celebrex 100 Mg Caps (Celecoxib) .... Once daily as needed 6)  Librax 2.5-5 Mg Caps (Clidinium-chlordiazepoxide) .... Take 1 tab four times a day as needed 7)  Cortef 10 Mg Tabs (Hydrocortisone) .... Take 1 by mouth q am 8)  Cortef 5 Mg Tabs (Hydrocortisone) .Marland Kitchen.. 1 by mouth q lunch

## 2010-03-19 NOTE — Progress Notes (Signed)
Summary: Call Report  Phone Note Other Incoming   Caller: [facility] Call Report Summary of Call: [facility] Triage Call Report Triage Record Num: [id number] Operator: April Finney Patient Name: [name] Call Date & Time: 07/03/2009 8:29:43PM Patient Phone: [phone] PCP: Sonda Primes Patient Gender: Female PCP Fax : [phone] Patient DOB: 01/06/1966 Practice Name: [facility] Reason for Call: Husband/Tom calling about abd pain, severe vomiting, and chest pain. Has history of problems with sodium and potassium levels. Had surgery in the past 6wks on her hand and had some complications also. Unable to keep anything down. Continuous vomiting. 911 care advice given. Protocol(s) Used: Abdominal Pain / Discomfort Recommended Outcome per Protocol: Activate EMS 911 Reason for Outcome: Any cardiac signs/symptoms for more than 5 minutes, now or within last hour Care Advice: Write down provider's name. List or place the following in a bag for transport with the patient: current prescription and/or OTC medications; alternative treatments, therapies and medications; and street drugs.  ~  ~ An adult should stay with the patient, preferably one trained in CPR.  ~ IMMEDIATE ACTION 05/ Initial call taken by: Margaret Pyle, CMA,  Jul 04, 2009 8:17 AM  Follow-up for Phone Call        Noted. Agree w/ER Follow-up by: Tresa Garter MD,  Jul 04, 2009 12:57 PM

## 2010-03-19 NOTE — Progress Notes (Signed)
Summary: Prevacid correction  Phone Note From Pharmacy   Caller: Gina[facility] Summary of Call: Pharmacy will not accept prevacid as written. Corrected. Initial call taken by: Lucious Groves,  May 28, 2009 10:12 AM    New/Updated Medications: PREVACID 30 MG  CPDR (LANSOPRAZOLE) take 1 by mouth two times a day Prescriptions: PREVACID 30 MG  CPDR (LANSOPRAZOLE) take 1 by mouth two times a day  #90 x 3   Entered by:   Lucious Groves   Authorized by:   Tresa Garter MD   Signed by:   Lucious Groves on 05/28/2009   Method used:   Faxed to ...       [facility] (mail[facility]order)       [address] [place], [place]  [address]       Ph: [phone]       Fax: [phone]   RxID:   [id number]

## 2010-03-19 NOTE — Progress Notes (Signed)
  Phone Note Outgoing Call   Call placed by: me Summary of Call: She needs a skin ca removed Initial call taken by: Tresa Garter MD,  February 28, 2009 11:49 AM

## 2010-03-19 NOTE — Letter (Signed)
[facility]   Imported By: Sherian Rein 12/04/2009 15:09:05  _____________________________________________________________________  External Attachment:    Type:   Image     Comment:   External Document

## 2010-03-19 NOTE — Assessment & Plan Note (Signed)
Summary: 4 mos well with labs/#/cd   Vital Signs:  Patient profile:   55 year old female Height:      66 inches Temp:     98.2 degrees F oral Pulse rate:   62 / minute Pulse rhythm:   regular Resp:     16 per minute BP sitting:   140 / 80  (left arm) Cuff size:   regular  Vitals Entered By: Lanier Prude, Beverly Gust) (January 13, 2010 2:11 PM) CC: CPX Is Patient Diabetic? No Comments pt is not taking Vit D, Klor Con, Vit b12, Librax, promethazine, Keflex or Gabapentin. She states she takes Celebrex 200mg  two times a day*****Please verify and correct on med ilst*****   CC:  CPX.  History of Present Illness: The patient presents for a preventive health examination  C/o R heel pain - bad  Current Medications (verified): 1)  Vitamin D3 1000 Unit  Tabs (Cholecalciferol) .Marland Kitchen.. 1 Qd 2)  Klor-Con M20 20 Meq  Tbcr (Potassium Chloride Crys Cr) .Marland Kitchen.. 1 By Mouth Qd 3)  Vitamin B-12 Cr 1000 Mcg  Tbcr (Cyanocobalamin) .... Take One Tablet By Mouth Daily 4)  Celebrex 100 Mg Caps (Celecoxib) .... Once Daily As Needed 5)  Librax 2.5-5 Mg  Caps (Clidinium-Chlordiazepoxide) .... Take 1 Tab Four Times A Day As Needed 6)  Cortef 10 Mg  Tabs (Hydrocortisone) .... Take 1 By Mouth Q Am 7)  Cortef 5 Mg Tabs (Hydrocortisone) .Marland Kitchen.. 1 By Mouth Q Lunch 8)  Promethazine Hcl 25 Mg Tabs (Promethazine Hcl) .Marland Kitchen.. 1-2 By Mouth Four Times A Day As Needed Nausea 9)  Zolpidem Tartrate 10 Mg Tabs (Zolpidem Tartrate) .Marland Kitchen.. 1 By Mouth At Bedtime As Needed 10)  Keflex 500 Mg Caps (Cephalexin) .Marland Kitchen.. 1 Four Times Daily 11)  Gabapentin 300 Mg Caps (Gabapentin) .Marland Kitchen.. 1 By Mouth Tid 12)  Prevacid 30 Mg Cpdr (Lansoprazole) .Marland Kitchen.. 1 By Mouth Once Daily  Allergies (verified): 1)  ! Doxycycline 2)  ! Codeine 3)  ! Hydrocodone  Past History:  Past Medical History: Last updated: 05/07/2009 Gout Hyponatremia 07-17-06 Balance problem  07-17-2006, likely central Hypotension with poss. adrenal insuff.     ? 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  The patient denies anorexia, fever, weight loss, weight gain, vision loss, decreased hearing, hoarseness, chest pain, syncope, dyspnea on exertion, peripheral edema, prolonged cough, headaches, hemoptysis, abdominal pain, melena, hematochezia, severe indigestion/heartburn, hematuria, incontinence, genital sores, muscle weakness, suspicious skin lesions, transient blindness, depression, unusual weight change, abnormal bleeding, enlarged lymph nodes, angioedema, and breast masses.    Physical Exam  General:  Well-developed,well-nourished,in no acute distress; alert,appropriate and cooperative throughout examination Head:  Normocephalic and atraumatic without obvious abnormalities. No apparent alopecia or balding. Eyes:  No corneal or conjunctival inflammation noted. EOMI. Perrla.  Ears:  External ear exam shows no significant lesions or deformities.  Otoscopic examination reveals clear canals, tympanic membranes are intact bilaterally without bulging, retraction, inflammation or discharge. Hearing  is grossly normal bilaterally. Nose:  External nasal examination shows no deformity or inflammation. Nasal mucosa are pink and moist without lesions or exudates. Mouth:  Oral mucosa and oropharynx without lesions or exudates.  Teeth in good repair. Neck:  No deformities, masses, or tenderness noted. Lungs:  Normal respiratory  effort, chest expands symmetrically. Lungs are clear to auscultation, no crackles or wheezes. Heart:  Normal rate and regular rhythm. S1 and S2 normal without gallop, murmur, click, rub or other extra sounds. Abdomen:  Bowel sounds positive,abdomen soft and non-tender without masses, organomegaly or hernias noted. Msk:  No deformity or scoliosis noted of thoracic or lumbar spine.   2nd L MCP dorsal aspect with much less swelling - not hot, non tender w/deep palpation R foot is tender over her heel bone in a CAM boot R Pulses:  R and L carotid,radial,femoral,dorsalis pedis and posterior tibial pulses are full and equal bilaterally Extremities:  No clubbing, cyanosis, edema, or deformity noted with normal full range of motion of all joints.   Neurologic:  No cranial nerve deficits noted. Station and gait are normal. Plantar reflexes are down-going bilaterally. DTRs are symmetrical throughout. Sensory, motor and coordinative functions appear intact. Skin:  scars post-op on chest and back, 2nd MCP L  Psych:  Cognition and judgment appear intact. Alert and cooperative with normal attention span and concentration. No apparent delusions, illusions, hallucinations   Impression & Recommendations:  Problem # 1:  ROUTINE GENERAL MEDICAL EXAM@HEALTH  CARE FACL (ICD-V70.0) Assessment New Health and age related issues were discussed. Available screening tests and vaccinations were discussed as well. Healthy life style including good diet and exercise was discussed.  The labs were reviewed with the patient.  Orders: EKG w/ Interpretation (93000) GYN q 12 months   Problem # 2:  VITAMIN B12 DEFICIENCY (ICD-266.2) Assessment: New See "Patient Instructions".  Orders: Vit B12 1000 mcg (J3420) Admin of Therapeutic Inj  intramuscular or subcutaneous (04540)  Problem # 3:  ANKLE PAIN (ICD-719.47)/foot pain R Assessment: Unchanged per ortho  Complete Medication List: 1)  Vitamin D3 1000 Unit Tabs  (Cholecalciferol) .Marland Kitchen.. 1 qd 2)  Klor-con M20 20 Meq Tbcr (Potassium chloride crys cr) .Marland Kitchen.. 1 by mouth qd 3)  Vitamin B-12 Cr 1000 Mcg Tbcr (Cyanocobalamin) .... Take one tablet by mouth daily 4)  Celebrex 100 Mg Caps (Celecoxib) .... Once daily as needed 5)  Librax 2.5-5 Mg Caps (Clidinium-chlordiazepoxide) .... Take 1 tab four times a day as needed 6)  Cortef 10 Mg Tabs (Hydrocortisone) .... Take 1 by mouth q am 7)  Cortef 5 Mg Tabs (Hydrocortisone) .Marland Kitchen.. 1 by mouth q lunch 8)  Promethazine Hcl 25 Mg Tabs (Promethazine hcl) .Marland Kitchen.. 1-2 by mouth four times a day as needed nausea 9)  Zolpidem Tartrate 10 Mg Tabs (Zolpidem tartrate) .Marland Kitchen.. 1 by mouth at bedtime as needed 10)  Keflex 500 Mg Caps (Cephalexin) .Marland Kitchen.. 1 four times daily 11)  Gabapentin 300 Mg Caps (Gabapentin) .Marland Kitchen.. 1 by mouth tid 12)  Prevacid 30 Mg Cpdr (Lansoprazole) .Marland Kitchen.. 1 by mouth once daily 13)  Vitamin B-12 1000 Mcg Subl (Cyanocobalamin) .Marland Kitchen.. 1 by mouth qd  Other Orders: Admin 1st Vaccine (98119) Flu Vaccine 28yrs + (14782)  Patient Instructions: 1)  Please schedule a follow-up appointment in 3 months. 2)  CBC w/ Diff prior to visit, ICD-9: 3)  Vit B12  266.20 Prescriptions: VITAMIN B-12 1000 MCG SUBL (CYANOCOBALAMIN) 1 by mouth qd  #100 x 3   Entered and Authorized by:  Tresa Garter MD   Signed by:   Tresa Garter MD on 01/13/2010   Method used:   Print then Give to Patient   RxID:   [id number] PREVACID 30 MG CPDR (LANSOPRAZOLE) 1 by mouth once daily  #90 x 3   Entered and Authorized by:   Tresa Garter MD   Signed by:   Tresa Garter MD on 01/13/2010   Method used:   Print then Give to Patient   RxID:   [id number] VITAMIN B-12 1000 MCG SUBL (CYANOCOBALAMIN) 1 by mouth qd  #100 x 3   Entered and Authorized by:   Tresa Garter MD   Signed by:   Tresa Garter MD on 01/13/2010   Method used:   Electronically to        [facility]* (retail)       [address] [place], [place]  [address]       Ph: [phone] or [phone]       Fax: [phone]   RxID:   [id number]    Medication Administration  Injection # 1:    Medication: Vit B12 1000 mcg    Diagnosis: VITAMIN B12 DEFICIENCY (ICD-266.2)    Route: IM    Site: R deltoid    Exp Date: 09/16/2011    Lot #: [id number]    Mfr: [employer] Regent    Patient tolerated injection without complications    Given by: Lanier Prude, CMA(AAMA) (January 13, 2010 3:59 PM)  Orders Added: 1)  EKG w/ Interpretation [93000] 2)  Vit B12 1000 mcg [J3420] 3)  Admin of Therapeutic Inj  intramuscular or subcutaneous [96372] 4)  Admin 1st Vaccine [90471] 5)  Flu Vaccine 55yrs + [90658] 6)  Est. Patient 40-64 years [99396]   Not Administered:    Influenza Vaccine not given             Flu Vaccine Consent Questions     Do you have a history of severe allergic reactions to this vaccine? no    Any prior history of allergic reactions to egg and/or gelatin? no    Do you have a sensitivity to the preservative Thimersol? no    Do you have a past history of Guillan-Barre Syndrome? no    Do you currently have an acute febrile illness? no    Have you ever had a severe reaction to latex? no    Vaccine information given and explained to patient? yes    Are you currently pregnant? no    Lot Number:[id number]   Exp Date:08/15/2010   Site Given  Left Deltoid IM Lanier Prude, [facility])  January 13, 2010 4:00 PM

## 2010-03-19 NOTE — Progress Notes (Signed)
Summary: Call Report  Phone Note Other Incoming   Caller: [facility] Call Report Summary of Call: [facility] Triage Call Report Triage Record Num: [id number] Operator: April Finney Patient Name: [name] Call Date & Time: 07/03/2009 8:29:43PM Patient Phone: [phone] PCP: Sonda Primes Patient Gender: Female PCP Fax : [phone] Patient DOB: 01/06/1966 Practice Name: [facility] Reason for Call: Husband/Tom calling about abd pain, severe vomiting, and chest pain. Has history of problems with sodium and potassium levels. Had surgery in the past 6wks on her hand and had some complications also. Unable to keep anything down. Continuous vomiting. 911 care advice given. Protocol(s) Used: Chest Pain / Discomfort Recommended Outcome per Protocol: Activate EMS 911 Reason for Outcome: Chest pain/discomfort for more than 5 minutes, now or within last hour Care Advice: Write down provider's name. List or place the following in a bag for transport with the patient: current prescription and/or OTC medications; alternative treatments, therapies and medications; and street drugs.  ~  ~ An adult should stay with the patient, preferably one trained in CPR.  ~ IMMEDIATE ACTION 05/ Initial call taken by: Margaret Pyle, CMA,  Jul 04, 2009 8:22 AM  Follow-up for Phone Call        as per 8:17 note Follow-up by: Tresa Garter MD,  Jul 04, 2009 12:58 PM

## 2010-03-19 NOTE — Progress Notes (Signed)
  Phone Note Outgoing Call   Summary of Call: I called pt  - went to er - better now Initial call taken by: Tresa Garter MD,  Jul 04, 2009 1:19 PM

## 2010-03-19 NOTE — Progress Notes (Signed)
  Phone Note From Other Clinic   Summary of Call: Per ortho needs Neurontin Initial call taken by: Tresa Garter MD,  December 19, 2009 8:52 AM  Follow-up for Phone Call        ok Follow-up by: Tresa Garter MD,  December 19, 2009 8:53 AM    New/Updated Medications: GABAPENTIN 300 MG CAPS (GABAPENTIN) 1 by mouth tid Prescriptions: GABAPENTIN 300 MG CAPS (GABAPENTIN) 1 by mouth tid  #270 x 1   Entered and Authorized by:   Tresa Garter MD   Signed by:   Tresa Garter MD on 12/19/2009   Method used:   Print then Give to Patient   RxID:   [id number]

## 2010-03-19 NOTE — Op Note (Signed)
Summary: [facility]   Imported By: Lennie Odor 12/10/2009 14:42:53  _____________________________________________________________________  External Attachment:    Type:   Image     Comment:   External Document

## 2010-03-19 NOTE — Letter (Signed)
Summary: [facility]  [facility]   Imported By: Sherian Rein 01/20/2010 13:40:00  _____________________________________________________________________  External Attachment:    Type:   Image     Comment:   External Document

## 2010-03-19 NOTE — Miscellaneous (Signed)
Summary: Rx   Clinical Lists Changes  Medications: Rx of CORTEF 10 MG  TABS (HYDROCORTISONE) take 1 by mouth q am;  #90 x 3;  Signed;  Entered by: Tresa Garter MD;  Authorized by: Tresa Garter MD;  Method used: Print then Give to Patient Rx of CORTEF 5 MG TABS (HYDROCORTISONE) 1 by mouth q lunch;  #90 x 3;  Signed;  Entered by: Tresa Garter MD;  Authorized by: Tresa Garter MD;  Method used: Print then Give to Patient    Prescriptions: CORTEF 5 MG TABS (HYDROCORTISONE) 1 by mouth q lunch  #90 x 3   Entered and Authorized by:   Tresa Garter MD   Signed by:   Tresa Garter MD on 09/18/2009   Method used:   Print then Give to Patient   RxID:   [id number] CORTEF 10 MG  TABS (HYDROCORTISONE) take 1 by mouth q am  #90 x 3   Entered and Authorized by:   Tresa Garter MD   Signed by:   Tresa Garter MD on 09/18/2009   Method used:   Print then Give to Patient   RxID:   [id number]

## 2010-03-19 NOTE — Progress Notes (Signed)
Summary: prevacid  Phone Note Refill Request Message from:  Fax from Pharmacy on May 21, 2009 1:33 PM  Refills Requested: Medication #1:  PREVACID 30 MG  CPDR take 1 by mouth two times a day qd  Method Requested: Fax to [facility] Initial call taken by: Orlan Leavens,  May 21, 2009 1:33 PM    Prescriptions: PREVACID 30 MG  CPDR (LANSOPRAZOLE) take 1 by mouth two times a day qd  #90 x 3   Entered by:   Orlan Leavens   Authorized by:   Tresa Garter MD   Signed by:   Orlan Leavens on 05/21/2009   Method used:   Faxed to ...       [facility] (mail-order)       [address] [place], [place]  [address]       Ph: [phone]       Fax: [phone]   RxID:   [id number]

## 2010-03-19 NOTE — Letter (Signed)
Summary: [facility]  [facility]   Imported By: Sherian Rein 04/15/2009 08:41:05  _____________________________________________________________________  External Attachment:    Type:   Image     Comment:   External Document

## 2010-03-19 NOTE — Miscellaneous (Signed)
Summary: Special Procedure consent/[facility]  Special Procedure consent/[facility]   Imported By: Lester Macomb 02/27/2009 09:53:56  _____________________________________________________________________  External Attachment:    Type:   Image     Comment:   External Document

## 2010-03-19 NOTE — Assessment & Plan Note (Signed)
Summary: nausea, fever / SD   Vital Signs:  Patient profile:   55 year old female Height:      66 inches O2 Sat:      99 % on Room air Temp:     98.5 degrees F oral Pulse rate:   60 / minute BP sitting:   132 / 68  (left arm) Cuff size:   regular  Vitals Entered By: Bill Salinas CMA (Jul 09, 2009 4:05 PM)  O2 Flow:  Room air CC: pt here with c/o nausea, she no longer takes vit d3, vit b12, celebrex or librax   CC:  pt here with c/o nausea, she no longer takes vit d3, vit b12, and celebrex or librax.  History of Present Illness: C/o n/v and fever every night w/sweats following swelling of L  2nd dorsal MCP finger post-op  (Dr Mina Marble). T 100F. She had 2 tick bites before and a brown recluse spider bite. Dr Lacretia Nicks. gave her a dose-pack.  Current Medications (verified): 1)  Prevacid 30 Mg  Cpdr (Lansoprazole) .... Take 1 By Mouth Two Times A Day 2)  Vitamin D3 1000 Unit  Tabs (Cholecalciferol) .Marland Kitchen.. 1 Qd 3)  Klor-Con M20 20 Meq  Tbcr (Potassium Chloride Crys Cr) .Marland Kitchen.. 1 By Mouth Qd 4)  Vitamin B-12 Cr 1000 Mcg  Tbcr (Cyanocobalamin) .... Take One Tablet By Mouth Daily 5)  Celebrex 100 Mg Caps (Celecoxib) .... Once Daily As Needed 6)  Librax 2.5-5 Mg  Caps (Clidinium-Chlordiazepoxide) .... Take 1 Tab Four Times A Day As Needed 7)  Cortef 10 Mg  Tabs (Hydrocortisone) .... Take 1 By Mouth Q Am 8)  Cortef 5 Mg Tabs (Hydrocortisone) .Marland Kitchen.. 1 By Mouth Q Lunch 9)  Promethazine Hcl 25 Mg Tabs (Promethazine Hcl) .Marland Kitchen.. 1-2 By Mouth Four Times A Day As Needed Nausea  Allergies: 1)  ! Doxycycline 2)  ! Codeine 3)  ! Hydrocodone  Past History:  Past Medical History: Last updated: 05/07/2009 Gout Hyponatremia 07-13-06 Balance problem  07-13-06, likely central Hypotension with poss. adrenal insuff.     ? 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  The patient denies abdominal pain, melena, incontinence, and abnormal bleeding.    Physical Exam  General:  Well-developed,well-nourished,in no acute distress; alert,appropriate and cooperative throughout examination Ears:  External ear exam shows no significant lesions or deformities.  Otoscopic examination reveals clear canals, tympanic membranes are intact bilaterally without bulging, retraction, inflammation or discharge. Hearing is grossly normal bilaterally. Nose:  External nasal examination shows no deformity or inflammation. Nasal mucosa are pink and moist without lesions or exudates. Mouth:  Oral mucosa and oropharynx without lesions or exudates.  Teeth in good repair. Neck:  No deformities, masses, or tenderness noted. Lungs:  Normal respiratory effort, chest expands symmetrically. Lungs are clear to auscultation, no crackles or wheezes. Heart:  Normal rate and regular rhythm. S1 and S2 normal without gallop, murmur, click, rub or other extra sounds. Abdomen:  Bowel sounds positive,abdomen soft and non-tender without masses, organomegaly or hernias noted. Msk:  No deformity or  scoliosis noted of thoracic or lumbar spine.   2nd L MCP dorsal aspect with 2.0x1.5x1.0 cm oval purple swelling - not hot, tender w/deep palpation, no fluctuation Extremities:  No clubbing, cyanosis, edema, or deformity noted with normal full range of motion of all joints.   Neurologic:  No cranial nerve deficits noted. Station and gait are normal. Plantar reflexes are down-going bilaterally. DTRs are symmetrical throughout. Sensory, motor and coordinative functions appear intact. Skin:  scars post-op on chest and back, 2nd MCP  L Psych:  Cognition and judgment appear intact. Alert and cooperative with normal attention span and concentration. No apparent delusions, illusions, hallucinations   Impression & Recommendations:  Problem # 1:  HAND PAIN (ICD-729.5) dorsal 2nd L MCP swelling Assessment New R/o atypical presentation of infection (on steroids) I will cover empirically w/Augmentin. F/u w/Dr Mina Marble Orders: T-Culture, Blood Routine ([address]) TLB-BMP (Basic Metabolic Panel-BMET) (80048-METABOL) TLB-CBC Platelet - w/Differential (85025-CBCD) TLB-Hepatic/Liver Function Pnl (80076-HEPATIC) TLB-Lipase (83690-LIPASE) TLB-Sedimentation Rate (ESR) (85652-ESR) TLB-TSH (Thyroid Stimulating Hormone) (84443-TSH) TLB-Udip ONLY (81003-UDIP) T-Hand Left 3 Views (73130TC)  Problem # 2:  FEVER UNSPECIFIED (ICD-780.60) due to #1 Assessment: New  Orders: TLB-BMP (Basic Metabolic Panel-BMET) (80048-METABOL) TLB-CBC Platelet - w/Differential (85025-CBCD) TLB-Hepatic/Liver Function Pnl (80076-HEPATIC) TLB-Lipase (83690-LIPASE) TLB-Sedimentation Rate (ESR) (85652-ESR) TLB-TSH (Thyroid Stimulating Hormone) (84443-TSH) TLB-Udip ONLY (81003-UDIP) T-Culture, Blood Routine (09811-91478)  Problem # 3:  VOMITING (ICD-787.03) Assessment: New Promethazine prn Orders: T-Culture, Blood Routine ([address]) TLB-BMP (Basic Metabolic Panel-BMET) (80048-METABOL) TLB-CBC Platelet - w/Differential (85025-CBCD) TLB-Hepatic/Liver Function Pnl (80076-HEPATIC) TLB-Lipase (83690-LIPASE) TLB-Sedimentation Rate (ESR) (85652-ESR) TLB-TSH (Thyroid Stimulating Hormone) (84443-TSH) TLB-Udip ONLY (81003-UDIP)  Complete Medication List: 1)  Prevacid 30 Mg Cpdr (Lansoprazole) .... Take 1 by mouth two times a day 2)  Vitamin D3 1000 Unit Tabs (Cholecalciferol) .Marland Kitchen.. 1 qd 3)  Klor-con M20 20 Meq Tbcr (Potassium chloride crys cr) .Marland Kitchen.. 1 by mouth qd 4)  Vitamin B-12 Cr 1000 Mcg Tbcr (Cyanocobalamin) .... Take one tablet by mouth  daily 5)  Celebrex 100 Mg Caps (Celecoxib) .... Once daily as needed 6)  Librax 2.5-5 Mg Caps (Clidinium-chlordiazepoxide) .... Take 1 tab four times a day as needed 7)  Cortef 10 Mg Tabs (Hydrocortisone) .... Take 1 by mouth q am 8)  Cortef 5 Mg Tabs (Hydrocortisone) .Marland Kitchen.. 1 by mouth q lunch 9)  Promethazine Hcl 25 Mg Tabs (Promethazine hcl) .Marland Kitchen.. 1-2 by mouth four times a day as needed nausea 10)  Augmentin 875-125 Mg Tabs (Amoxicillin-pot clavulanate) .Marland Kitchen.. 1 by mouth bid  Patient Instructions: 1)  Call if you are not better in a reasonable amount of time or if worse.  Prescriptions: AUGMENTIN 875-125 MG TABS (AMOXICILLIN-POT CLAVULANATE) 1 by mouth bid  #28 x 1   Entered and Authorized by:   Tresa Garter MD   Signed by:   Tresa Garter MD on 07/09/2009   Method used:   Electronically to        [facility]* (retail)       [address] [place], [place]  [address]       Ph: [phone] or [phone]       Fax: [phone]   RxID:   [id number]

## 2010-04-13 ENCOUNTER — Encounter (INDEPENDENT_AMBULATORY_CARE_PROVIDER_SITE_OTHER): Payer: Self-pay | Admitting: *Deleted

## 2010-04-13 ENCOUNTER — Other Ambulatory Visit: Payer: BC Managed Care – PPO

## 2010-04-13 ENCOUNTER — Other Ambulatory Visit: Payer: Self-pay | Admitting: Internal Medicine

## 2010-04-13 DIAGNOSIS — E538 Deficiency of other specified B group vitamins: Secondary | ICD-10-CM

## 2010-04-13 LAB — CBC WITH DIFFERENTIAL/PLATELET
Basophils Relative: 1.1 % (ref 0.0–3.0)
Eosinophils Relative: 2.1 % (ref 0.0–5.0)
Hemoglobin: 13.6 g/dL (ref 12.0–15.0)
Lymphocytes Relative: 25.9 % (ref 12.0–46.0)
Neutro Abs: 5.4 10*3/uL (ref 1.4–7.7)
Neutrophils Relative %: 63.7 % (ref 43.0–77.0)
RBC: 4.36 Mil/uL (ref 3.87–5.11)
WBC: 8.6 10*3/uL (ref 4.5–10.5)

## 2010-04-14 ENCOUNTER — Telehealth: Payer: Self-pay | Admitting: Internal Medicine

## 2010-04-17 ENCOUNTER — Ambulatory Visit (INDEPENDENT_AMBULATORY_CARE_PROVIDER_SITE_OTHER): Payer: BC Managed Care – PPO | Admitting: Internal Medicine

## 2010-04-17 ENCOUNTER — Encounter: Payer: Self-pay | Admitting: Internal Medicine

## 2010-04-17 DIAGNOSIS — E538 Deficiency of other specified B group vitamins: Secondary | ICD-10-CM

## 2010-04-17 DIAGNOSIS — M25519 Pain in unspecified shoulder: Secondary | ICD-10-CM | POA: Insufficient documentation

## 2010-04-17 DIAGNOSIS — M79609 Pain in unspecified limb: Secondary | ICD-10-CM

## 2010-04-17 DIAGNOSIS — M25579 Pain in unspecified ankle and joints of unspecified foot: Secondary | ICD-10-CM

## 2010-04-23 NOTE — Progress Notes (Signed)
Summary: Rf Zolpidem  Phone Note Refill Request Message from:  Fax from Pharmacy  Refills Requested: Medication #1:  ZOLPIDEM TARTRATE 10 MG TABS 1 by mouth at bedtime as needed   Dosage confirmed as above?Dosage Confirmed   Supply Requested: 30   Last Refilled: 01/14/2010  Method Requested: Telephone to Pharmacy Initial call taken by: Lanier Prude, [facility]),  April 14, 2010 4:51 PM  Follow-up for Phone Call        ok 3 ref Follow-up by: Tresa Garter MD,  April 14, 2010 5:40 PM    Prescriptions: ZOLPIDEM TARTRATE 10 MG TABS (ZOLPIDEM TARTRATE) 1 by mouth at bedtime as needed  #30 x 3   Entered by:   Lamar Sprinkles, CMA   Authorized by:   Tresa Garter MD   Signed by:   Lamar Sprinkles, CMA on 04/14/2010   Method used:   Telephoned to ...       [facility]* (retail)       [address] [place], [place]  [address]       Ph: [phone] or [phone]       Fax: [phone]   RxID:   [id number]

## 2010-04-28 ENCOUNTER — Telehealth: Payer: Self-pay | Admitting: Internal Medicine

## 2010-04-28 NOTE — Assessment & Plan Note (Signed)
Summary: 3 MO FU/NWS#   Vital Signs:  Patient profile:   55 year old female Height:      66 inches Temp:     98.2 degrees F oral Pulse rate:   80 / minute Pulse rhythm:   regular Resp:     16 per minute BP sitting:   140 / 90  (left arm) Cuff size:   regular  Vitals Entered By: Lanier Prude, Beverly Gust) (April 17, 2010 2:48 PM) CC: 3 mo f/u  Is Patient Diabetic? No Comments pt is not tkaing Vit D, Klor Con, Librax, Keflex or Gabapentin   CC:  3 mo f/u .  History of Present Illness: C/o severe pain in R upper arm 15/10 and shoulder off and on - unable to sleep x days. Prcocet did not touch it. She has a cerv spine MRI scheduled. No injury.  Current Medications (verified): 1)  Vitamin D3 1000 Unit  Tabs (Cholecalciferol) .Marland Kitchen.. 1 Qd 2)  Klor-Con M20 20 Meq  Tbcr (Potassium Chloride Crys Cr) .Marland Kitchen.. 1 By Mouth Qd 3)  Vitamin B-12 Cr 1000 Mcg  Tbcr (Cyanocobalamin) .... Take One Tablet By Mouth Daily 4)  Celebrex 100 Mg Caps (Celecoxib) .... Once Daily As Needed 5)  Librax 2.5-5 Mg  Caps (Clidinium-Chlordiazepoxide) .... Take 1 Tab Four Times A Day As Needed 6)  Cortef 10 Mg  Tabs (Hydrocortisone) .... Take 1 By Mouth Q Am 7)  Cortef 5 Mg Tabs (Hydrocortisone) .Marland Kitchen.. 1 By Mouth Q Lunch 8)  Promethazine Hcl 25 Mg Tabs (Promethazine Hcl) .Marland Kitchen.. 1-2 By Mouth Four Times A Day As Needed Nausea 9)  Zolpidem Tartrate 10 Mg Tabs (Zolpidem Tartrate) .Marland Kitchen.. 1 By Mouth At Bedtime As Needed 10)  Keflex 500 Mg Caps (Cephalexin) .Marland Kitchen.. 1 Four Times Daily 11)  Gabapentin 300 Mg Caps (Gabapentin) .Marland Kitchen.. 1 By Mouth Tid 12)  Prevacid 30 Mg Cpdr (Lansoprazole) .Marland Kitchen.. 1 By Mouth Once Daily 13)  Vitamin B-12 1000 Mcg Subl (Cyanocobalamin) .Marland Kitchen.. 1 By Mouth Qd 14)  Lyrica 75 Mg Caps (Pregabalin) .Marland Kitchen.. 1 By Mouth Two Times A Day  Allergies (verified): 1)  ! Doxycycline 2)  ! Codeine 3)  ! Hydrocodone  Past History:  Past Medical History: Last updated: 05/07/2009 Gout Hyponatremia 07-20-06 Balance problem  07/20/2006,  likely central Hypotension with poss. adrenal insuff.     ? 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    Physical Exam  General:  Well-developed,well-nourished,in no acute distress; alert,appropriate and cooperative throughout examination Nose:  External nasal examination shows no deformity or inflammation. Nasal mucosa are pink and moist without lesions or exudates. Mouth:  Oral mucosa and oropharynx without lesions or exudates.  Teeth in good repair. Neck:  No deformities, masses, or tenderness noted. Heart:  Normal rate and regular rhythm. S1 and S2 normal without gallop, murmur, click, rub or other extra sounds. Abdomen:  Bowel sounds positive,abdomen soft and non-tender without masses, organomegaly or hernias noted. Msk:  No deformity or scoliosis noted of thoracic or lumbar spine.   R upper arm and shoulder w/deltoid and trap muscles are tender Neurologic:  No cranial nerve deficits noted. Station and gait are normal. Plantar reflexes are down-going bilaterally. DTRs are symmetrical  throughout. Sensory, motor and coordinative functions appear intact. Skin:  scars post-op on chest and back, 2nd MCP L  Psych:  Cognition and judgment appear intact. Alert and cooperative with normal attention span and concentration. No apparent delusions, illusions, hallucinations   Impression & Recommendations:  Problem # 1:  SHOULDER PAIN (ICD-719.41) R - poss cervical radiculopathy Assessment New MRI is pending  Ortho cons w/Dr Caprice Red Her updated medication list for this problem includes:    Celebrex 100 Mg Caps (Celecoxib) ..... Once daily as needed    Butrans 20 Mcg/hr Ptwk (Buprenorphine) .Marland Kitchen... 1  q 1wk    Vimovo 500-20 Mg Tbec (Naproxen-esomeprazole) .Marland Kitchen... 1 by mouth once daily - two times a day pc as needed pain Lidocaine patch was applied. Coban wrap was provided  Problem # 2:  VITAMIN B12 DEFICIENCY (ICD-266.2) Assessment: Unchanged On the regimen of medicine(s) reflected in the chart    Problem # 3:  HAND PAIN (ICD-729.5) R due to #1 Assessment: New  Problem # 4:  ANKLE PAIN (ICD-719.47) Assessment: Improved  Complete Medication List: 1)  Vitamin D3 1000 Unit Tabs (Cholecalciferol) .Marland Kitchen.. 1 qd 2)  Klor-con M20 20 Meq Tbcr (Potassium chloride crys cr) .Marland Kitchen.. 1 by mouth qd 3)  Vitamin B-12 Cr 1000 Mcg Tbcr (Cyanocobalamin) .... Take one tablet by mouth daily 4)  Celebrex 100 Mg Caps (Celecoxib) .... Once daily as needed 5)  Librax 2.5-5 Mg Caps (Clidinium-chlordiazepoxide) .... Take 1 tab four times a day as needed 6)  Cortef 10 Mg Tabs (Hydrocortisone) .... Take 1 by mouth q am 7)  Cortef 5 Mg Tabs (Hydrocortisone) .Marland Kitchen.. 1 by mouth q lunch 8)  Promethazine Hcl 25 Mg Tabs (Promethazine hcl) .Marland Kitchen.. 1-2 by mouth four times a day as needed nausea 9)  Zolpidem Tartrate 10 Mg Tabs (Zolpidem tartrate) .Marland Kitchen.. 1 by mouth at bedtime as needed 10)  Keflex 500 Mg Caps (Cephalexin) .Marland Kitchen.. 1 four times daily 11)  Prevacid 30 Mg Cpdr (Lansoprazole) .Marland Kitchen.. 1 by mouth once daily 12)  Vitamin B-12 1000 Mcg Subl (Cyanocobalamin) .Marland Kitchen.. 1 by mouth qd 13)  Lyrica 200 Mg Caps (Pregabalin) .Marland Kitchen.. 1 by mouth up to three times a day 14)  Butrans 20 Mcg/hr Ptwk (Buprenorphine) .Marland Kitchen.. 1 q 1wk 15)  Vimovo 500-20 Mg Tbec (Naproxen-esomeprazole) .Marland Kitchen.. 1 by mouth once daily - two times a day pc as needed pain 16)  Lidoderm 5 % Ptch (Lidocaine) .... Use 1-3 patches two times a day to skin  Other Orders: Coban Wrap,  <3 in/yd ([id number])  Patient Instructions: 1)  Please schedule a follow-up appointment in 6 weeks. 2)  Call if you are not better in a reasonable amount of time or if worse. Go to ER if feeling really bad!    Prescriptions: LYRICA 200 MG CAPS (PREGABALIN) 1 by mouth up to three times a day  #90 x 3   Entered and Authorized by:   Tresa Garter MD   Signed by:   Tresa Garter MD on 04/17/2010   Method used:   Print then Give to Patient   RxID:   [id number] LIDODERM 5 % PTCH (LIDOCAINE) use 1-3 patches two times a day to skin  #180 x 2   Entered and Authorized by:   Tresa Garter MD   Signed by:   Tresa Garter MD on 04/17/2010   Method used:   Print then Give to Patient   RxID:   [id number] VIMOVO 500-20 MG TBEC (NAPROXEN-ESOMEPRAZOLE) 1 by  mouth once daily - two times a day pc as needed pain  #60 x 3   Entered and Authorized by:   Tresa Garter MD   Signed by:   Tresa Garter MD on 04/17/2010   Method used:   Print then Give to Patient   RxID:   [id number] BUTRANS 20 MCG/HR PTWK (BUPRENORPHINE) 1 q 1wk  #4 x 3   Entered and Authorized by:   Tresa Garter MD   Signed by:   Tresa Garter MD on 04/17/2010   Method used:   Print then Give to Patient   RxID:   [id number]    Orders Added: 1)  Est. Patient Level IV [84696] 2)  Coban Wrap,  <3 in/yd [E9528]

## 2010-05-04 LAB — APTT: aPTT: 24 seconds (ref 24–37)

## 2010-05-04 LAB — DIFFERENTIAL
Basophils Absolute: 0 10*3/uL (ref 0.0–0.1)
Eosinophils Relative: 1 % (ref 0–5)
Lymphocytes Relative: 2 % — ABNORMAL LOW (ref 12–46)
Lymphs Abs: 0.3 10*3/uL — ABNORMAL LOW (ref 0.7–4.0)
Neutro Abs: 13.9 10*3/uL — ABNORMAL HIGH (ref 1.7–7.7)
Neutrophils Relative %: 95 % — ABNORMAL HIGH (ref 43–77)

## 2010-05-04 LAB — COMPREHENSIVE METABOLIC PANEL
ALT: 19 U/L (ref 0–35)
AST: 27 U/L (ref 0–37)
Albumin: 4 g/dL (ref 3.5–5.2)
Chloride: 108 mEq/L (ref 96–112)
Creatinine, Ser: 0.9 mg/dL (ref 0.4–1.2)
GFR calc Af Amer: >60 mL/min (ref >60)
Potassium: 3.2 mEq/L — ABNORMAL LOW (ref 3.5–5.1)
Sodium: 140 mEq/L (ref 135–145)
Total Bilirubin: 0.8 mg/dL (ref 0.3–1.2)

## 2010-05-04 LAB — CBC
Platelets: 240 10*3/uL (ref 150–400)
RDW: 12.3 % (ref 11.5–15.5)
WBC: 14.6 10*3/uL — ABNORMAL HIGH (ref 4.0–10.5)

## 2010-05-04 LAB — POCT I-STAT, CHEM 8
BUN: 17 mg/dL (ref 6–23)
Calcium, Ion: 1.04 mmol/L — ABNORMAL LOW (ref 1.12–1.32)
Glucose, Bld: 128 mg/dL — ABNORMAL HIGH (ref 70–99)
TCO2: 19 mmol/L (ref 0–100)

## 2010-05-04 LAB — LACTIC ACID, PLASMA: Lactic Acid, Venous: 2.7 mmol/L — ABNORMAL HIGH (ref 0.5–2.2)

## 2010-05-04 LAB — POCT CARDIAC MARKERS: Troponin i, poc: <0.05 ng/mL (ref 0.00–0.09)

## 2010-05-05 NOTE — Progress Notes (Signed)
Summary: MED REACTION?  Phone Note Call from Patient Call back at Work Phone [phone]   Summary of Call: Pt c/o swelling in legs and arms since starting patch. Should she stop med? What is alternative?  Initial call taken by: Lamar Sprinkles, CMA,  April 28, 2010 2:35 PM  Follow-up for Phone Call        Pt informed to stop patch. Says this is the only thing that has helped w/her pain. What should she do as an alternative?  Follow-up by: Lamar Sprinkles, CMA,  April 28, 2010 3:27 PM  Additional Follow-up for Phone Call Additional follow up Details #1::        Is she ehure the swelling is not related to Vimovo? Additional Follow-up by: Tresa Garter MD,  April 29, 2010 10:56 PM    Additional Follow-up for Phone Call Additional follow up Details #2::    SPoke w/pt, Pt says she is in extreme pain, had PT yesterday which has made pain worse. Ortho advised that she had 2 bulging discs showing no impingement but could still be the cause of the pain. Swelling is not better after stopping the patch. She will stop vimovo (already took today). How soon can she resume the butrans patch as this has been only good pain relief option so far? .......................Marland KitchenLamar Sprinkles, CMA  April 30, 2010 12:18 PM   Additional Follow-up for Phone Call Additional follow up Details #3:: Details for Additional Follow-up Action Taken: OK to resume Lidoderm D/c Vimovo Restart Butrans Take Furosemide x 1-3 d............Marland KitchenMarland KitchenGeorgina Quint Plotnikov MD,  April 30, 2010 12:54 PM  left mess to call office back................Marland KitchenLamar Sprinkles, CMA  April 30, 2010 3:41 PM   Pt informed  Additional Follow-up by: Lamar Sprinkles, CMA,  April 30, 2010 5:03 PM  New/Updated Medications: FUROSEMIDE 20 MG TABS (FUROSEMIDE) 1or 2  by mouth qam as needed for swelling as needed Prescriptions: FUROSEMIDE 20 MG TABS (FUROSEMIDE) 1or 2  by mouth qam as needed for swelling as needed  #30 x 1   Entered and Authorized by:    Tresa Garter MD   Signed by:   Lamar Sprinkles, CMA on 04/30/2010   Method used:   Electronically to        [facility]* (retail)       [address] [place], [place]  [address]       Ph: [phone] or [phone]       Fax: [phone]   RxID:   [id number]

## 2010-05-20 ENCOUNTER — Other Ambulatory Visit: Payer: Self-pay | Admitting: Internal Medicine

## 2010-05-20 MED ORDER — HYDROCORTISONE 10 MG PO TABS: 10.0000 mg | ORAL_TABLET | ORAL | Status: DC

## 2010-05-29 ENCOUNTER — Ambulatory Visit (INDEPENDENT_AMBULATORY_CARE_PROVIDER_SITE_OTHER): Payer: BC Managed Care – PPO | Admitting: Internal Medicine

## 2010-05-29 ENCOUNTER — Encounter: Payer: Self-pay | Admitting: Internal Medicine

## 2010-05-29 ENCOUNTER — Other Ambulatory Visit (INDEPENDENT_AMBULATORY_CARE_PROVIDER_SITE_OTHER): Payer: BC Managed Care – PPO

## 2010-05-29 VITALS — BP 108/78 | HR 76 | Temp 98.3°F | Resp 16

## 2010-05-29 DIAGNOSIS — E538 Deficiency of other specified B group vitamins: Secondary | ICD-10-CM

## 2010-05-29 DIAGNOSIS — R5383 Other fatigue: Secondary | ICD-10-CM

## 2010-05-29 DIAGNOSIS — R5381 Other malaise: Secondary | ICD-10-CM

## 2010-05-29 DIAGNOSIS — E871 Hypo-osmolality and hyponatremia: Secondary | ICD-10-CM

## 2010-05-29 DIAGNOSIS — I9589 Other hypotension: Secondary | ICD-10-CM

## 2010-05-29 DIAGNOSIS — M25519 Pain in unspecified shoulder: Secondary | ICD-10-CM

## 2010-05-29 LAB — BASIC METABOLIC PANEL
CO2: 23 mEq/L (ref 19–32)
Chloride: 105 mEq/L (ref 96–112)
Creatinine, Ser: 0.6 mg/dL (ref 0.4–1.2)
Potassium: 4.1 mEq/L (ref 3.5–5.1)
Sodium: 138 mEq/L (ref 135–145)

## 2010-05-29 NOTE — Assessment & Plan Note (Signed)
Get more sleep

## 2010-05-29 NOTE — Assessment & Plan Note (Signed)
Surgery pending 4/25

## 2010-05-29 NOTE — Assessment & Plan Note (Signed)
Cont Rx 

## 2010-05-29 NOTE — Assessment & Plan Note (Signed)
Controlled with current meds.

## 2010-05-29 NOTE — Assessment & Plan Note (Signed)
Recheck labs 

## 2010-05-29 NOTE — Progress Notes (Signed)
  Subjective:    Patient ID: [name], female    DOB: [birth year], 55 y.o.   MRN: [id number]  HPI   The patient is here to follow up on chronic depression, anxiety, headaches and chronic moderate fatigue symptoms due to lack of sleep; pain has not been controlled with medicines, diet and exercise. C/o severe R arm pain. Surgery is sch for 4/25 Dr Thurston Hole.   Review of Systems  Constitutional: Positive for fatigue. Negative for fever.  HENT: Negative for congestion.   Eyes: Negative for pain.  Respiratory: Negative for shortness of breath.   Cardiovascular: Negative for chest pain.  Gastrointestinal: Negative for abdominal pain.  Musculoskeletal: Positive for arthralgias. Negative for myalgias, back pain and gait problem.  Skin: Negative for rash.  Neurological: Negative for syncope.  Psychiatric/Behavioral: The patient is not nervous/anxious.        Objective:   Physical Exam  Nursing note and vitals reviewed. Constitutional: She appears well-developed.       Looks tired  HENT:  Head: Normocephalic.  Eyes: Pupils are equal, round, and reactive to light. Right eye exhibits no discharge.  Neck: No JVD present. No tracheal deviation present.  Cardiovascular:  No murmur heard. Abdominal: She exhibits no mass.  Musculoskeletal: She exhibits tenderness (R neck, R shoulder are tense and painful). She exhibits no edema.  Skin: No erythema. No pallor.  Psychiatric: She has a normal mood and affect. Judgment normal.          Assessment & Plan:  SHOULDER PAIN Surgery pending 4/25. Try Opana.  VITAMIN B12 DEFICIENCY Cont Rx  HYPONATREMIA Recheck labs  HYPOTENSION, CHRONIC Controlled with current meds  OTHER MALAISE AND FATIGUE Get more sleep

## 2010-05-29 NOTE — Patient Instructions (Signed)
Try Opana

## 2010-06-30 NOTE — H&P (Signed)
[name], [name]            ACCOUNT NO.:  [id number]   MEDICAL RECORD NO.:  [id number]          PATIENT TYPE:  INP   LOCATION:  4710                         FACILITY:  [facility]   PHYSICIAN:  Georgina Quint. Plotnikov, MDDATE OF BIRTH:  Dec 04, 1955   DATE OF ADMISSION:  06/24/2006  DATE OF DISCHARGE:                              HISTORY & PHYSICAL   CHIEF COMPLAINT:  Lightheaded, dizzy and nauseated, epigastric pain.   HISTORY OF PRESENT ILLNESS:  The patient is 55 year old female generally  in good health, who presents today with 2-1/2 weeks of feeling dizzy and  nauseated with a lot of epigastric/chest pain and a period of  lightheadedness.  The family was very concerned, saying that her color  is real bad and that she has to see a doctor.  She went to work today in  the morning.  The symptoms started 2-1/2 weeks ago, getting  progressively worse.   PAST MEDICAL HISTORY:  1. Baker's cyst, left leg.  2. CT scan of the chest reveals a 1 x 2-mm right middle lobe nodule.  3. History of arthroscopic left knee surgery.  4. Gout.  5. Partial hysterectomy.   ALLERGIES:  CODEINE.   CURRENT MEDICATIONS:  1. Colchicine p.r.n.  2. Indocin SR 75 mg that she has been taking daily due to gout      problems.  3. She stopped allopurinol; she had a sense it was not helping.  4. Colchicine 0.6 mg daily.  5. Calcium with vitamin D.  6. Zantac 300 mg daily.   SOCIAL HISTORY:  She is a Water quality scientist.  She is married, 1 living son, 2  recently died of complications of diabetes.  She does not smoke.  She  drinks alcohol socially.   FAMILY HISTORY:  Father died at age of 65 with diabetes, coronary  disease and CVA.  Mother is living at age 14.  One sister died of  diabetes.  Two brothers, 1 with gout.   REVIEW OF SYSTEMS:  As above.  She appears to have lost weight.  No  black stools.  No vomiting.  Denies indigestion.  Lightheaded, but no  full syncope, no fall.  The rest of the 18-point  review of systems is  negative or as above.   PHYSICAL EXAMINATION:  VITAL SIGNS:  Blood pressure 133/87, sitting;  upright 160/100.  Pulse 74.  Weight 150 pounds (was 162).  HEENT:  With moist mucosa.  No obvious pallor.  NECK:  Supple.  No thyromegaly or bruit.  LUNGS:  Clear.  No wheezes or rales.  HEART:  S1 and S2.  No murmur, no gallop.  Slightly tachycardic when  upright.  ABDOMEN:  Soft, nontender, except in the epigastric area.  No pain.  No  rebound symptoms.  No masses or hernias felt.  lower extremities  Without edema.  RECTAL:  Examination was not done.  SKIN:  Clear.  NEUROLOGIC:  She is alert, oriented and cooperative.   LABORATORY DATA:  EKG today was normal sinus rhythm, no acute changes.   ASSESSMENT AND PLAN:  1. Chest pain/epigastric pain, possible end-stage-induced ulcer, given  2 Prevacids in the office.  After a long discussion, she agreed to      be admitted.  Will admit to Telemetry, obtain Gastroenterology      consult with Dr. Juanda Chance, start on intravenous Protonix, intravenous      morphine as needed for pain.  2. Near-syncope, suspect occult gastrointestinal blood loss.  Plan as      above.  Obtain CBC STAT.  We will use of intravenous normal saline.      Rule-out myocardial infarction laboratories ordered.  3. Gout, not well-controlled.  We will have to discontinue the Indocin      anyway, continue with colchicine as needed.  She was instructed not      to drive before she left the office.      Georgina Quint. Plotnikov, MD  Electronically Signed     AVP/MEDQ  D:  06/24/2006  T:  06/25/2006  Job:  [id number]

## 2010-06-30 NOTE — Consult Note (Signed)
[facility]                          ENDOCRINOLOGY CONSULTATION   FORTUNE, TOROSIAN                   MRN:          [id number]  DATE:07/06/2006                            DOB:          [birth year]    REFERRING PHYSICIAN:  Vikki Ports A. Felicity Coyer, MD   REASON FOR REFERRAL:  Hyponatremia.   HISTORY OF PRESENT ILLNESS:  A 55 year old woman recently admitted to  the hospital with hyponatremia.  Symptomatically, she states that she  has a chronic headache.  She also has excessive thirst for the past  month, and her husband reports excessive water drinking.   She has symptoms of intermittent nausea which are slightly improved.   She has some nonpostural dizziness, fatigue, and difficulty with  concentration.  She states that she is feeling a little better in  general since she was in the hospital.   PAST MEDICAL HISTORY:  1. Gout.  2. Anxiety/insomnia.   SOCIAL HISTORY:  She works doing Surveyor, mining.  She is here  with her husband.   FAMILY HISTORY:  Negative for the above.   REVIEW OF SYSTEMS:  Denies change in her weight.  She has a minimal  intermittent abdominal pain.   PHYSICAL EXAMINATION:  VITAL SIGNS:  Blood pressure 154/100, heart rate  77, temperature 97.6.  The weight is 149.  GENERAL:  She appears slightly ill.  SKIN:  Not diaphoretic.  No rash.  Normal pigmentation.  HEENT:  No proptosis.  No periorbital swelling.  Pharynx:  Mucous  membranes are moist.  NECK:  Supple.  No goiter.  CHEST:  Clear to auscultation.  No respiratory distress.  CARDIOVASCULAR:  No edema.  Regular rate and rhythm.  No murmur.  Pedal  pulses are intact.  ABDOMEN:  Soft and nontender.  No hepatosplenomegaly.  No mass.  NEUROLOGIC:  Alert and well-oriented.  She appears slightly depressed,  although this may be an effect of her recent illness.   LABORATORY STUDIES:  Hospital results are reviewed, including the  hyponatremia.  She also had a  low cortisol in the hospital.   IMPRESSION:  1. She had a low spot cortisol in the hospital.  This does not      diagnosis adrenal insufficiency.  2. Hyponatremia:  In the setting of excessive water drinking, there is      a question raised of psychogenic polydipsia.  3. The other symptoms related above could potentially be due to      hyponatremia.   PLAN:  1. Change hydrocortisone to prednisone 2.5 mg a day.  2. Continue the Declomycin for now.  3. Check BMET today.  4. Return in a few weeks, when, depending on how she feels, ACTH      stimulation test will be considered.     Sean A. Everardo All, MD  Electronically Signed    SAE/MedQ  DD: 07/06/2006  DT: 07/07/2006  Job #: [id number]   cc:   Sharman Cheek Plotnikov, MD

## 2010-06-30 NOTE — Assessment & Plan Note (Signed)
[facility]                           PRIMARY CARE OFFICE NOTE   CLELLA, MCKEEL                   MRN:          [id number]  DATE:09/06/2006                            DOB:          24-Jan-1956    The patient is a 55 year old female who presents for a followup of her  unsteady gait, lack of balance, episodic severe fatigue, difficulty  focusing.  She had been on Florinef lately for orthostatic hypertension;  it gives her relief for a couple of hours after each dose.  She  complaine of more pain in the left knee, occasional falls.  Denies  headache, double vision, blurred vision, etc.   CURRENT MEDICINES:  Florinef 0.2 b.i.d. at morning and lunch.   ALLERGIES:  DICYCLOMINE, rash.   REVIEW OF SYSTEMS:  She was feeling pretty much normal when she took  hydrocortisone after her hospital stay. Adrenal gland insufficiency was  ruled out by Dr. Everardo All (endocrinology).  Balance problems, fatigue,  falls, foggy thinking present.   PHYSICAL EXAMINATION:  Blood pressure 160/90 supine, 160/90 standing  upright.  She is in no acute distress.  Unsteady.  HEENT:  Moist.  LUNGS:  Clear.  HEART:  Regular.  ABDOMEN:  Soft, nontender.  Straight line walking unsteady, one foot stand unsteady, no nystagmus,  no pronator drift.  Muscle strength seems to be symmetric and normal.  She is alert, oriented and cooperative, denies being depressed.   Labs on September 02, 2006, sodium 142, glucose 121, creatinine 0.8.   ASSESSMENT AND PLAN:  1. A combination of weakness, fatigue, inability to focus well,      unsteady gait, unclear etiology.  Her brain MRI was normal in March      08.  She has a good clinical response to hydrocortisone orally .      One would think of a presence of some kind of an encephalitis,      central nervous system vasculitis and others.  The presence of knee      joint pain makes one think of Lyme disease and such.  I will put      her  empirically back on Cortef 10 mg in the morning, 5 at lunch.  I      will start empirically on amoxicillin 500 two twice a day for 21      days.  Will redraw blood work in 4 to 6 weeks.  Obtain neurology      consultation with Dr. Orlin Hilding.  2. Hyponatremia, resolved.  Again, it could have been a sign of      encephalitis.  3. History of gout.  Plan as above.  4. Adrenal insufficiency ruled out (had 1 low cortisone level at the      hospital.  Cortrosyn stimulation test was normal).     Georgina Quint. Plotnikov, MD  Electronically Signed    AVP/MedQ  DD: 09/06/2006  DT: 09/06/2006  Job #: [id number]   cc:   Santina Evans A. Orlin Hilding, M.D.

## 2010-06-30 NOTE — Consult Note (Signed)
[facility]                          ENDOCRINOLOGY CONSULTATION   DEION, FORGUE                   MRN:          [id number]  DATE:08/03/2006                            DOB:          [birth year]    REASON FOR VISIT:  Followup hyponatremia.   HISTORY OF PRESENT ILLNESS:  7. A 55 year old woman who has now been on prednisone for two days.      She was started on hydrocortisone in the hospital recently with a      suspicion of adrenal insufficiency.   The rash that she reported last time she was here is much better.  1. She now has three days of nausea and dizziness.   Regarding her hypokalemia, she is still on potassium chloride 20 mEq  daily.   PAST MEDICAL HISTORY:  Same as Jun 17, 2006.   REVIEW OF SYSTEMS:  Denies syncope, denies vomiting.   PHYSICAL EXAMINATION:  Blood pressure 144/92, heart rate 78, temperature  is 96.7.  The weight is 144.  GENERAL:  No distress.  EXTREMITIES:  No edema.  ABDOMEN:  Soft, nontender, no hepatosplenomegaly, no mass, not  distended.   LABORATORY STUDIES:  BMET normal including a potassium of 3.9.  Amylase  and hepatic function are also normal.  She also has a baseline cortisol  of 12.3 mcg/dL.  She received 250 mcg of cosyntropin, intramuscularly,  in the office; and 45 minutes later another cortisol is 26.6 mcg/dL.   IMPRESSION:  1. Hyponatremia, resolved.  HPA/Adrenal insufficiency is excluded.  2. Mild hypokalemia, better.  3. Nausea uncertain etiology.  4. Rash possibly due to Declomycin, resolved.   PLAN:  1. Okay to stay off the prednisone.  2. Continue the potassium chloride for now; as running her potassium      higher may prevent a recurrence of her hyponatremia.  3. Zofran-ODT 4 mg q.6 h. as needed for nausea.  4. I would like to leave the question of continuing the potassium to      Dr. Posey Rea.     Sean A. Everardo All, MD  Electronically Signed    SAE/MedQ  DD: 08/07/2006  DT:  08/08/2006  Job #: [id number]   cc:   Georgina Quint. Plotnikov, MD

## 2010-06-30 NOTE — Op Note (Signed)
[name], [name]            ACCOUNT NO.:  [id number]   MEDICAL RECORD NO.:  [id number]          PATIENT TYPE:  AMB   LOCATION:  DSC                          FACILITY:  [facility]   PHYSICIAN:  Robert A. Thurston Hole, M.D. 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   PROCEDURES PERFORMED:  1. Left knee EUA, followed by arthroscopic partial medial and lateral      meniscectomies.  2. Left knee chondroplasty.   SURGEON:  Elana Alm. Thurston Hole, M.D.   ASSISTANT:  Julien Girt, P.A.   ANESTHESIA:  General.   OPERATIVE TIME:  Of 45 minutes.   COMPLICATIONS:  None.   INDICATION FOR PROCEDURE:  [name] is a 55 year old woman who has  had significant left knee pain for the past 3 to 4 months, increasing in  nature with exam and x-rays consistent with medial and lateral meniscal  tears.  She has failed conservative care and is now to undergo  arthroscopy.   DESCRIPTION:  [name] was brought into the operating room on  05/15/2007, and placed on the operating room table in the supine  position.  After an adequate level of general anesthesia was obtained,  her left knee was examined.  She had full range of motion and her knee  was stable ligamentous exam with normal patellar tracking.  The knee was  sterilely injected with 0.25% Marcaine with epinephrine.  She received  Ancef 1 gram IV preoperatively for prophylaxis.  The left leg was  examined under anesthesia.  She had full range of motion, stable  ligamentous exam, with normal patellar tracking.  The left leg was then  prepped and draped using sterile technique.  Originally through an  anterolateral portal, the arthroscope with a pump attached was placed  into an  anteromedial portal and arthroscopic probe was placed.  On  initial inspection, medial compartment showed grade 1 and 2  chondromalacia.  The medial meniscus tear, posterior and medial horn 20%  was resected back to a stable rim.  Intercondylar notch inspected.  Anterior and posterior cruciate ligaments were normal.  Lateral  compartment inspected.  Articular cartilage showed grade 1 and 2  chondromalacia.  The lateral meniscus showed a split tear of the  anterior and lateral horn of the lateral meniscus, of which 75% to 80%  was torn and this was resected back to a stable rim.  The posterior  aspect of the lateral horn and the entire posterior horn were intact.  The popliteus tendon was intact.  The patellofemoral joint showed grade  1 and 2 chondromalacia.  The patella tracked normally.  The medial and  lateral gutters were free of pathology.  At this point, it was felt that  all pathology had been satisfactorily addressed.  The instruments were  removed.  The portals were closed with 3-0 nylon suture and injected  with 0.25% Marcaine  with epinephrine and 4 mg of morphine.  Sterile  dressings were applied and the patient was awakened and taken to the  recovery room in stable condition.   FOLLOW-UP CARE:  [name] will be followed as an outpatient on  Percocet and Mobic.  She will be back in the office in a week for  sutures and in follow-up.      Robert A. Thurston Hole, M.D.  Electronically Signed     RAW/MEDQ  D:  05/15/2007  T:  05/15/2007  Job:  [id number]

## 2010-06-30 NOTE — Discharge Summary (Signed)
NAMEKIMETHA, TRULSON            ACCOUNT NO.:  [id number]   MEDICAL RECORD NO.:  [id number]          PATIENT TYPE:  INP   LOCATION:  4710                         FACILITY:  [facility]   PHYSICIAN:  Valerie A. Felicity Coyer, MDDATE OF BIRTH:  09/13/1955   DATE OF ADMISSION:  06/24/2006  DATE OF DISCHARGE:  06/29/2006                               DISCHARGE SUMMARY   DISCHARGE DIAGNOSES:  1. Adrenal insufficiency, question Addison's disease.  2. Hyponatremia/syndrome of inappropriate antidiuretic hormone, now      stable on Declomycin.  3. Likely depression.   HISTORY OF PRESENT ILLNESS:  Ms. Kowal is a 55 year old female who  presented on Jun 24, 2006 with chief complaint of lightheadedness,  dizziness, nausea and epigastric pain.  These symptoms started  approximately 2-1/2 weeks prior to admission and started getting  progressively worse.  She was admitted for further evaluation and  treatment.   PAST MEDICAL HISTORY:  1. Baker's cyst left leg.  2. A 1 x 2 mm right middle lobe nodule noted on CT scan.  3. History of arthroscopic left knee surgery.  4. Gout.  5. Partial hysterectomy.   COURSE OF HOSPITALIZATION.:  1. Adrenal insufficiency, rule out Addison's disease.  The patient was      admitted and was initially seen by [facility] due to nausea and      epigastric pain.  An EGD was performed which showed mild gastritis.      This was performed by Dr. Arlyce Dice.  It was felt that this did not      necessarily explain her pain.  She will be continued off of Indocin      due to gastritis.  In addition, a fasting cortisol level was noted      to be depressed with a value of 2.5.  She was started on IV Solu-      Cortef with some improvement in her symptoms.  At this time we will      plan to continue hydrocortisone 10 mg p.o. q.a.m. and 5 mg p.o.      q.p.m. with close outpatient followup with endocrinology.  In      addition, she has been started on Declomycin 150 mg p.o. b.i.d.  for      hyponatremia.  2. Depression.  The patient notes having lost her son recently and      reports poor appetite since that time.  To consider addition of an      SSRI if patient willing.  Will defer to patient's primary care.   MEDICATIONS AT TIME OF DISCHARGE:  1. Hydrocortisone 10 mg p.o. q.a.m. and 5 mg p.o. q.p.m.  2. Colchicine 0.6 mg p.o. daily.  3. Declomycin 150 mg p.o. b.i.d.  4. Zantac 300 mg p.o. daily.  5. Calcium plus D as before.   PERTINENT LABORATORY DATA AT TIME OF DISCHARGE:  BUN 3, creatinine 0.67,  TSH 0.872, calcium 9.1.   DISPOSITION:  The patient will be discharged to home.   FOLLOWUP:  The patient is instructed to follow up with Dr. Posey Rea on  May 21 at 4:15 p.m. and Dr.  Everardo All on May 21 at 3:15 p.m.  She is  instructed to return to the emergency room if she develops increased  weakness, nausea, dizziness, abdominal or chest pain.  She will need  followup BMP at her appointment to evaluate potassium and sodium levels  as she required some oral repletion for hypokalemia during this  admission which has resolved.      Sandford Craze, NP      Raenette Rover. Felicity Coyer, MD  Electronically Signed    MO/MEDQ  D:  06/29/2006  T:  06/29/2006  Job:  [id number]   cc:   Georgina Quint. Plotnikov, MD  Sean A. Everardo All, MD

## 2010-06-30 NOTE — Letter (Signed)
September 06, 2006    Trial [address] Administrator  ATTN:  Payton Mccallum Excuse Request  [address]  [place], [place] [address]   RE:  [name], [name]  MRN:  [id number]  /  DOB:  [birth year]   Dear Trial Court Administrator:   Please excuse Ms. [name] from jury duty on Thursday, September 22, 2006, juror #[id number], due to medical reasons.  She will not be able to  sit on a jury or focus for any prolonged period of time.    Sincerely,      Georgina Quint. Plotnikov, MD  Electronically Signed    AVP/MedQ  DD: 09/06/2006  DT: 09/07/2006  Job #: [id number]

## 2010-07-03 NOTE — Op Note (Signed)
[name], [name]                      ACCOUNT NO.:  [id number]   MEDICAL RECORD NO.:  [id number]                   PATIENT TYPE:  AMB   LOCATION:  DAY                                  FACILITY:  [facility]   PHYSICIAN:  Marlowe Kays, M.D.               DATE OF BIRTH:  04-23-1955   DATE OF PROCEDURE:  08/16/2003  DATE OF DISCHARGE:                                 OPERATIVE REPORT   PREOPERATIVE DIAGNOSIS:  Torn lateral meniscus, left knee.   POSTOPERATIVE DIAGNOSIS:  Torn lateral meniscus, left knee.   OPERATION:  Left knee arthroscopy with partial lateral meniscectomy.   SURGEON:  Marlowe Kays, M.D.   ANESTHESIA:  General.   INDICATIONS FOR PROCEDURE:  She has persistent lateral knee pain with an MRI  demonstrating what appeared to be a tear of the anterior portion of the  lateral meniscus.  In actuality, at surgery this looked normal, but she did  have a definite tear of the intercondylar area, which was in contrast to the  whole remainder of the knee, which looked entirely normal.   PROCEDURE:  Satisfactory general anesthesia.  Pneumatic tourniquet.  The  knee was esmarched up nonsterilely.  A thigh stabilizer.  The leg was  prepped with Duraprep from the thigh stabilizer and ankle draped in a  sterile field.  The right leg was wrapped with Ace wrap and supported with  knee support.  Superior medial saline inflow first through an anterior  medial port.  The lateral compartmental knee joint was evaluated.  As  discussed above, the only abnormal finding was in the intercondylar area,  where there was a tear, which was depicted.  I resected this back to a  stable rim with a combination of baskets.  Final pictures were taken.  Joint  surfaces looked good.  Looking up the lateral suprapatellar area, no  additional abnormalities were noted.  I then reversed portals, took  representative pictures of the ACL, medial meniscus, and medial joint, which  were all normal.  The  knee joint was then irrigated until clear and outflow  port removed.  The transient portals were closed with 4-0 nylon.  Marcaine  20 cc 0.5% with Adrenaline was then instilled through the inflow apparatus,  which was removed, and the portal was closed with 4-0 nylon as well.  Betadine Adaptic dressings were applied.  The tourniquet was released.  At  the time of dictation, she was on her way to the recovery room in  satisfactory condition with no known complications.                                               Marlowe Kays, M.D.    JA/MEDQ  D:  08/16/2003  T:  08/16/2003  Job:  [id number]

## 2010-07-03 NOTE — Assessment & Plan Note (Signed)
[facility]                           PRIMARY CARE OFFICE NOTE   [name], [name]                   MRN:          [id number]  DATE:04/28/2006                            DOB:          13-Sep-1955    The patient is a 55 year old female who presents to establish a primary.  She complains of having a cold with cough of 2 days duration. Inability  to sleep. Bothered with gout. Left knee surgery.   PAST MEDICAL HISTORY:  1. A Baker's cyst on the left leg diagnosed by ultrasound on March      3rd.  2. CT of the chest on the same day revealed a 1 x 2 mm right middle      lobe nodule.  3. History of arthroscopic left knee surgery.  4. Gout, one episode.  5. Partial hysterectomy.   ALLERGIES:  CODEINE.   CURRENT MEDICATIONS:  Colchicine and Indocin that she finished.   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 One living son,  one son recently died likely of complications of diabetes. She is a  nonsmoker and does drink alcohol.   FAMILY HISTORY:  Her father died at age 72 with diabetes, coronary  disease and CVA. Mother is well at age 13. A sister died of diabetes.  Two bothers, one with gout.   REVIEW OF SYSTEMS:  Cough productive of yellow sputum and nasal  congestion. Has had some chest pain which resolved. No shortness of  breath, no syncope. Chronic pain in the knees, more on the left. Overall  getting better. The rest of the 18 point review of systems is negative  otherwise.   PHYSICAL EXAMINATION:  VITAL SIGNS: Blood pressure 127/85, pulse 81,  temperature 98.3, weight 165 pounds.  GENERAL:  In no acute distress.  HEENT: Moist mucosa.  NECK: Supple, no thyromegaly or bruit.  LUNGS: Clear, no wheezes or rales.  HEART: No murmur, no gallops.  ABDOMEN: Soft and nontender, no organomegaly, no masses.  LOWER EXTREMITIES: Without edema. Left knee has been swollen  posteriorly. No bruises.  NEURO: She is alert and  appropriate. Denies being depressed.   EKG is normal.   LABS FROM HOSPITAL: Including CT from March 2nd and 3rd reviewed.   ASSESSMENT AND PLAN:  1. Bronchitis acute. Prescribed Z-Pak for 5 days. Tessalon 200 mg p.o.      t.i.d.  2. Baker's cyst left knee, improving.  3. Throat pain of 2 week's duration, unclear etiology but probably      related to problem number one. Thyroid feels normal. She will let      me know if not resolved.  4. CAT scan of right middle lobe, tiny nodule. Need to follow up in a      year with a chest x-ray or a CT scan.  5. Insomnia. Ambien 10 mg at h.s. p.r.n.  6. Grief - discussed. Not interested in medications or counseling at      present.  7. She needs to take calcium and vitamin D. Advised to make an      appointment with a  gynecologist. Advised mammogram, bone density      scan.  8. She needs to start thinking about colonoscopy.     Georgina Quint. Plotnikov, MD  Electronically Signed    AVP/MedQ  DD: 04/28/2006  DT: 04/29/2006  Job #: [id number]

## 2010-08-28 ENCOUNTER — Encounter: Payer: Self-pay | Admitting: Internal Medicine

## 2010-08-28 ENCOUNTER — Ambulatory Visit (INDEPENDENT_AMBULATORY_CARE_PROVIDER_SITE_OTHER): Payer: BC Managed Care – PPO | Admitting: Internal Medicine

## 2010-08-28 DIAGNOSIS — I9589 Other hypotension: Secondary | ICD-10-CM

## 2010-08-28 DIAGNOSIS — M25519 Pain in unspecified shoulder: Secondary | ICD-10-CM

## 2010-08-28 DIAGNOSIS — L570 Actinic keratosis: Secondary | ICD-10-CM

## 2010-08-28 DIAGNOSIS — E538 Deficiency of other specified B group vitamins: Secondary | ICD-10-CM

## 2010-08-28 DIAGNOSIS — R42 Dizziness and giddiness: Secondary | ICD-10-CM

## 2010-08-28 MED ORDER — HYDROCORTISONE 5 MG PO TABS: 5.0000 mg | ORAL_TABLET | Freq: Every day | ORAL | Status: DC

## 2010-08-28 MED ORDER — HYDROCORTISONE 10 MG PO TABS: 10.0000 mg | ORAL_TABLET | ORAL | Status: DC

## 2010-08-28 NOTE — Progress Notes (Signed)
  Subjective:    Patient ID: [name], female    DOB: [birth year], 55 y.o.   MRN: [id number]  HPI   The patient presents for a follow-up of  chronic hypotension, chronic OA, B 12 def controlled with medicines She stopped most of her meds  Review of Systems  Constitutional: Positive for fatigue. Negative for chills, activity change, appetite change and unexpected weight change.  HENT: Negative for congestion, mouth sores and sinus pressure.   Eyes: Negative for visual disturbance.  Respiratory: Negative for cough and chest tightness.   Gastrointestinal: Negative for nausea and abdominal pain.  Genitourinary: Negative for frequency, difficulty urinating and vaginal pain.  Musculoskeletal: Positive for arthralgias. Negative for back pain and gait problem.       R shoulder hurts  Skin: Negative for pallor and rash.  Neurological: Negative for dizziness, tremors, weakness, numbness and headaches.  Psychiatric/Behavioral: Negative for confusion and sleep disturbance.       Objective:   Physical Exam  Constitutional: She appears well-developed and well-nourished. No distress.  HENT:  Head: Normocephalic.  Right Ear: External ear normal.  Left Ear: External ear normal.  Nose: Nose normal.  Mouth/Throat: Oropharynx is clear and moist.  Eyes: Conjunctivae are normal. Pupils are equal, round, and reactive to light. Right eye exhibits no discharge. Left eye exhibits no discharge.  Neck: Normal range of motion. Neck supple. No JVD present. No tracheal deviation present. No thyromegaly present.  Cardiovascular: Normal rate, regular rhythm and normal heart sounds.   Pulmonary/Chest: No stridor. No respiratory distress. She has no wheezes.  Abdominal: Soft. Bowel sounds are normal. She exhibits no distension and no mass. There is no tenderness. There is no rebound and no guarding.  Musculoskeletal: She exhibits tenderness (R neck base was tender). She exhibits no edema.    Lymphadenopathy:    She has no cervical adenopathy.  Neurological: She displays normal reflexes. No cranial nerve deficit. She exhibits normal muscle tone. Coordination normal.  Skin: No rash noted. No erythema.       AK x2 on R chest  Psychiatric: Her behavior is normal. Judgment and thought content normal.          Assessment & Plan:

## 2010-08-28 NOTE — Assessment & Plan Note (Signed)
On Rx 

## 2010-08-28 NOTE — Assessment & Plan Note (Signed)
Procedure Note :     Procedure : Cryosurgery   Indication:    Actinic keratosis(es)   Risks including unsuccessful procedure , bleeding, infection, bruising, scar, a need for a repeat  procedure and others were explained to the patient in detail as well as the benefits. Informed consent was obtained verbally.   2  lesion(s)  on    R upper chest was/were treated with liquid nitrogen on a Q-tip in a usual fasion . Band-Aid was applied and antibiotic ointment was given for a later use.   Tolerated well. Complications none.   Postprocedure instructions :     Keep the wounds clean. You can wash them with liquid soap and water. Pat dry with gauze or a Kleenex tissue  Before applying antibiotic ointment and a Band-Aid.   You need to report immediately  if  any signs of infection develop.

## 2010-08-28 NOTE — Assessment & Plan Note (Signed)
Post-repair 2012 - better

## 2010-08-28 NOTE — Assessment & Plan Note (Signed)
Doing well 

## 2010-11-06 ENCOUNTER — Other Ambulatory Visit: Payer: Self-pay | Admitting: Internal Medicine

## 2010-11-09 ENCOUNTER — Telehealth: Payer: Self-pay | Admitting: *Deleted

## 2010-11-09 LAB — BASIC METABOLIC PANEL
CO2: 24
Chloride: 98
GFR calc Af Amer: >60
Glucose, Bld: 92
Potassium: 3.8
Sodium: 132 — ABNORMAL LOW

## 2010-11-09 NOTE — Telephone Encounter (Signed)
Spoke w/pt - she c/o chest congestion and fever. Scheduled OV in open apt time w/Dr Jonny Ruiz tomorrow. Advised to call office w/any change in symptoms.

## 2010-11-10 ENCOUNTER — Encounter: Payer: Self-pay | Admitting: Internal Medicine

## 2010-11-10 ENCOUNTER — Ambulatory Visit (INDEPENDENT_AMBULATORY_CARE_PROVIDER_SITE_OTHER): Payer: BC Managed Care – PPO | Admitting: Internal Medicine

## 2010-11-10 VITALS — BP 104/82 | HR 82 | Temp 98.1°F | Ht 67.0 in

## 2010-11-10 DIAGNOSIS — I9589 Other hypotension: Secondary | ICD-10-CM

## 2010-11-10 DIAGNOSIS — J209 Acute bronchitis, unspecified: Secondary | ICD-10-CM

## 2010-11-10 DIAGNOSIS — K219 Gastro-esophageal reflux disease without esophagitis: Secondary | ICD-10-CM

## 2010-11-10 MED ORDER — HYDROCODONE-HOMATROPINE 5-1.5 MG/5ML PO SYRP: 5.0000 mL | ORAL_SOLUTION | Freq: Four times a day (QID) | ORAL | Status: AC | PRN

## 2010-11-10 MED ORDER — LEVOFLOXACIN 500 MG PO TABS: 500.0000 mg | ORAL_TABLET | Freq: Every day | ORAL | Status: AC

## 2010-11-10 NOTE — Patient Instructions (Signed)
Take all new medications as prescribed Continue all other medications as before  

## 2010-11-14 ENCOUNTER — Encounter: Payer: Self-pay | Admitting: Internal Medicine

## 2010-11-14 NOTE — Assessment & Plan Note (Signed)
stable overall by hx and exam, most recent data reviewed with pt, and pt to continue medical treatment as before 

## 2010-11-14 NOTE — Assessment & Plan Note (Signed)
Mild to mod, for antibx course,  to f/u any worsening symptoms or concerns 

## 2010-11-14 NOTE — Assessment & Plan Note (Signed)
stable overall by hx and exam, most recent data reviewed with pt, and pt to continue medical treatment as before  Lab Results  Component Value Date   WBC 8.6 04/13/2010   HGB 13.6 04/13/2010   HCT 39.0 04/13/2010   PLT 278.0 04/13/2010   CHOL 197 01/07/2010   TRIG 123.0 01/07/2010   HDL 89.90 01/07/2010   ALT 13 01/07/2010   AST 13 01/07/2010   NA 138 05/29/2010   K 4.1 05/29/2010   CL 105 05/29/2010   CREATININE 0.6 05/29/2010   BUN 11 05/29/2010   CO2 23 05/29/2010   TSH 2.17 01/07/2010   INR 0.96 07/03/2009

## 2010-11-14 NOTE — Progress Notes (Signed)
Subjective:    Patient ID: [name], female    DOB: 1955/06/03, 55 y.o.   MRN: [id number]  HPI  Here with acute onset mild to mod 8 days ST, HA, general weakness and malaise, with prod cough greenish sputum, but Pt denies chest pain, increased sob or doe, wheezing, orthopnea, PND, increased LE swelling, palpitations, dizziness or syncope. Pt denies new neurological symptoms such as new headache, or facial or extremity weakness or numbness   Pt denies polydipsia, polyuria,   Pt states overall good compliance with meds, trying to follow lower cholesterol diet, wt overall stable but little exercise however.   Overall good compliance with treatment, and good medicine tolerability.   Pt denies fever, wt loss, night sweats, loss of appetite, or other constitutional symptoms except for recent with above.  Denies worsening reflux, dysphagia, abd pain, n/v, bowel change or blood.  Past Medical History  Diagnosis Date  . Gout   . Hyponatremia 07/24/06  . Balance problem 24-Jul-2006    Likely central  . Hypotension     Poss adrenal insuff. ? Viral encephalitis vm [place] 07-24-06  . GERD (gastroesophageal reflux disease)   . Grief     Son died 07/23/05  . Osteoarthritis     LEFT KNEE  . Vitamin B 12 deficiency 24-Jul-2007    Borderline  . Skin cancer 07-23-09  . Adrenal insufficiency     post-viral   Past Surgical History  Procedure Date  . Wrist surgery     Right/ Dr Caprice Red 07/23/2008  . 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 Tarsal tunnel release July 23, 2009    Right Dr Lajoyce Corners    reports that she has never smoked. She does not have any smokeless tobacco history on file. She reports that she does not drink alcohol or use illicit drugs. family history includes Diabetes in her father, mother, and other; Heart disease in her father; and Stroke in her father. Allergies  Allergen Reactions  . Butrans (Buprenorphine Hcl) Swelling  . Codeine     REACTION: stomach pain  . Doxycycline     REACTION: rash  .  Hydrocodone     REACTION: stomach pain   Current Outpatient Prescriptions on File Prior to Visit  Medication Sig Dispense Refill  . Cholecalciferol 1000 UNITS tablet Take 1,000 Units by mouth daily.        . Cyanocobalamin (VITAMIN B-12) 1000 MCG SUBL Place under the tongue daily.        . hydrocortisone (CORTEF) 10 MG tablet Take 1 tablet (10 mg total) by mouth every morning.  90 tablet  3  . hydrocortisone (CORTEF) 10 MG tablet TAKE 1 EVERY MORNING  90 tablet  2  . hydrocortisone (CORTEF) 5 MG tablet Take 1 tablet (5 mg total) by mouth daily with lunch.  90 tablet  3   Review of Systems Review of Systems  Constitutional: Negative for diaphoresis and unexpected weight change.  HENT: Negative for drooling and tinnitus.   Eyes: Negative for photophobia and visual disturbance.  Respiratory: Negative for choking and stridor.   Gastrointestinal: Negative for vomiting and blood in stool.  Genitourinary: Negative for hematuria and decreased urine volume.  Musculoskeletal: Negative for gait problem.       Objective:   Physical Exam BP 104/82  Pulse 82  Temp(Src) 98.1 F (36.7 C) (Oral)  Ht 5\' 7"  (1.702 m)  SpO2 99% Physical Exam  VS noted, mild ill Constitutional: Pt appears  well-developed and well-nourished.  HENT: Head: Normocephalic.  Right Ear: External ear normal.  Left Ear: External ear normal.  Bilat tm's mild erythema.  Sinus nontender.  Pharynx mild erythema Eyes: Conjunctivae and EOM are normal. Pupils are equal, round, and reactive to light.  Neck: Normal range of motion. Neck supple.  Cardiovascular: Normal rate and regular rhythm.   Pulmonary/Chest: Effort normal and breath sounds normal.  Neurological: Pt is alert. No cranial nerve deficit.  Skin: Skin is warm. No erythema.  Psychiatric: Pt behavior is normal. Thought content normal.     Assessment & Plan:

## 2010-12-29 ENCOUNTER — Other Ambulatory Visit (INDEPENDENT_AMBULATORY_CARE_PROVIDER_SITE_OTHER): Payer: BC Managed Care – PPO

## 2010-12-29 DIAGNOSIS — E538 Deficiency of other specified B group vitamins: Secondary | ICD-10-CM

## 2010-12-29 DIAGNOSIS — I9589 Other hypotension: Secondary | ICD-10-CM

## 2010-12-29 DIAGNOSIS — R42 Dizziness and giddiness: Secondary | ICD-10-CM

## 2010-12-29 LAB — COMPREHENSIVE METABOLIC PANEL
ALT: 22 U/L (ref 0–35)
AST: 22 U/L (ref 0–37)
CO2: 24 mEq/L (ref 19–32)
Calcium: 9 mg/dL (ref 8.4–10.5)
Chloride: 103 mEq/L (ref 96–112)
GFR: 90.51 mL/min (ref >60.00)
Potassium: 4 mEq/L (ref 3.5–5.1)
Sodium: 138 mEq/L (ref 135–145)
Total Protein: 7 g/dL (ref 6.0–8.3)

## 2010-12-29 LAB — URINALYSIS
Bilirubin Urine: NEGATIVE
Ketones, ur: NEGATIVE
Leukocytes, UA: NEGATIVE
Nitrite: NEGATIVE
Urobilinogen, UA: 0.2 (ref 0.0–1.0)
pH: 6 (ref 5.0–8.0)

## 2010-12-29 LAB — CBC WITH DIFFERENTIAL/PLATELET
Basophils Absolute: 0.1 10*3/uL (ref 0.0–0.1)
Eosinophils Absolute: 0.2 10*3/uL (ref 0.0–0.7)
HCT: 41.3 % (ref 36.0–46.0)
Lymphocytes Relative: 25.6 % (ref 12.0–46.0)
Lymphs Abs: 2.6 10*3/uL (ref 0.7–4.0)
MCHC: 33.7 g/dL (ref 30.0–36.0)
Monocytes Relative: 6.2 % (ref 3.0–12.0)
Neutro Abs: 6.6 10*3/uL (ref 1.4–7.7)
Platelets: 268 10*3/uL (ref 150.0–400.0)
RDW: 12.9 % (ref 11.5–14.6)

## 2010-12-29 LAB — TSH: TSH: 0.68 u[IU]/mL (ref 0.35–5.50)

## 2010-12-29 LAB — VITAMIN B12: Vitamin B-12: 360 pg/mL (ref 211–911)

## 2011-01-01 ENCOUNTER — Encounter: Payer: Self-pay | Admitting: Internal Medicine

## 2011-01-01 ENCOUNTER — Ambulatory Visit (INDEPENDENT_AMBULATORY_CARE_PROVIDER_SITE_OTHER): Payer: BC Managed Care – PPO | Admitting: Internal Medicine

## 2011-01-01 DIAGNOSIS — E538 Deficiency of other specified B group vitamins: Secondary | ICD-10-CM

## 2011-01-01 DIAGNOSIS — R42 Dizziness and giddiness: Secondary | ICD-10-CM

## 2011-01-01 DIAGNOSIS — M109 Gout, unspecified: Secondary | ICD-10-CM

## 2011-01-01 DIAGNOSIS — M1990 Unspecified osteoarthritis, unspecified site: Secondary | ICD-10-CM

## 2011-01-01 DIAGNOSIS — I9589 Other hypotension: Secondary | ICD-10-CM

## 2011-01-01 DIAGNOSIS — K219 Gastro-esophageal reflux disease without esophagitis: Secondary | ICD-10-CM

## 2011-01-01 MED ORDER — HYDROCORTISONE 10 MG PO TABS: 10.0000 mg | ORAL_TABLET | ORAL | Status: DC

## 2011-01-01 MED ORDER — PREGABALIN 200 MG PO CAPS: 200.0000 mg | ORAL_CAPSULE | Freq: Every day | ORAL | Status: DC

## 2011-01-01 MED ORDER — HYDROCORTISONE 5 MG PO TABS: 5.0000 mg | ORAL_TABLET | Freq: Every day | ORAL | Status: DC

## 2011-01-01 MED ORDER — AZITHROMYCIN 250 MG PO TABS: ORAL_TABLET | ORAL | Status: AC

## 2011-01-01 NOTE — Assessment & Plan Note (Signed)
Continue with current prescription therapy as reflected on the Med list.  

## 2011-01-01 NOTE — Assessment & Plan Note (Signed)
Doing well now 

## 2011-01-01 NOTE — Assessment & Plan Note (Signed)
Improved

## 2011-01-01 NOTE — Progress Notes (Signed)
  Subjective:    Patient ID: [name], female    DOB: [birth year], 55 y.o.   MRN: [id number]  HPI   The patient is here to follow up on chronic adrenal depression, anxiety, headaches and chronic moderate OA symptoms controlled with medicines, diet and exercise.   Review of Systems  Constitutional: Negative for chills, activity change, appetite change, fatigue and unexpected weight change.  HENT: Negative for congestion, mouth sores and sinus pressure.   Eyes: Negative for visual disturbance.  Respiratory: Negative for cough and chest tightness.   Gastrointestinal: Negative for nausea and abdominal pain.  Genitourinary: Negative for frequency, difficulty urinating and vaginal pain.  Musculoskeletal: Positive for arthralgias and gait problem. Negative for back pain.  Skin: Negative for pallor and rash.  Neurological: Negative for dizziness, tremors, weakness, numbness and headaches.  Psychiatric/Behavioral: Negative for confusion and sleep disturbance.       Objective:   Physical Exam  Constitutional: She appears well-developed. No distress.       Obese  HENT:  Head: Normocephalic.  Right Ear: External ear normal.  Left Ear: External ear normal.  Nose: Nose normal.  Mouth/Throat: Oropharynx is clear and moist.  Eyes: Conjunctivae are normal. Pupils are equal, round, and reactive to light. Right eye exhibits no discharge. Left eye exhibits no discharge.  Neck: Normal range of motion. Neck supple. No JVD present. No tracheal deviation present. No thyromegaly present.  Cardiovascular: Normal rate, regular rhythm and normal heart sounds.   Pulmonary/Chest: No stridor. No respiratory distress. She has no wheezes.  Abdominal: Soft. Bowel sounds are normal. She exhibits no distension and no mass. There is no tenderness. There is no rebound and no guarding.  Musculoskeletal: She exhibits tenderness (B knees). She exhibits no edema.  Lymphadenopathy:    She has no cervical  adenopathy.  Neurological: She displays normal reflexes. No cranial nerve deficit. She exhibits normal muscle tone. Coordination normal.  Skin: No rash noted. No erythema.  Psychiatric: She has a normal mood and affect. Her behavior is normal. Judgment and thought content normal.    Lab Results  Component Value Date   WBC 10.1 12/29/2010   HGB 13.9 12/29/2010   HCT 41.3 12/29/2010   PLT 268.0 12/29/2010   GLUCOSE 80 12/29/2010   CHOL 197 01/07/2010   TRIG 123.0 01/07/2010   HDL 89.90 01/07/2010   LDLCALC 83 01/07/2010   ALT 22 12/29/2010   AST 22 12/29/2010   NA 138 12/29/2010   K 4.0 12/29/2010   CL 103 12/29/2010   CREATININE 0.7 12/29/2010   BUN 10 12/29/2010   CO2 24 12/29/2010   TSH 0.68 12/29/2010   INR 0.96 07/03/2009         Assessment & Plan:

## 2011-02-25 IMAGING — CR DG HAND COMPLETE 3+V*L*
3 series · 3 of 3 positions shown · non-contrast
Comparison: None.

CLINICAL DATA: Left second metacarpal phalangeal joint swelling and
pain.

LEFT HAND - COMPLETE 3+ VIEW

[view not recorded (1 of 3)]
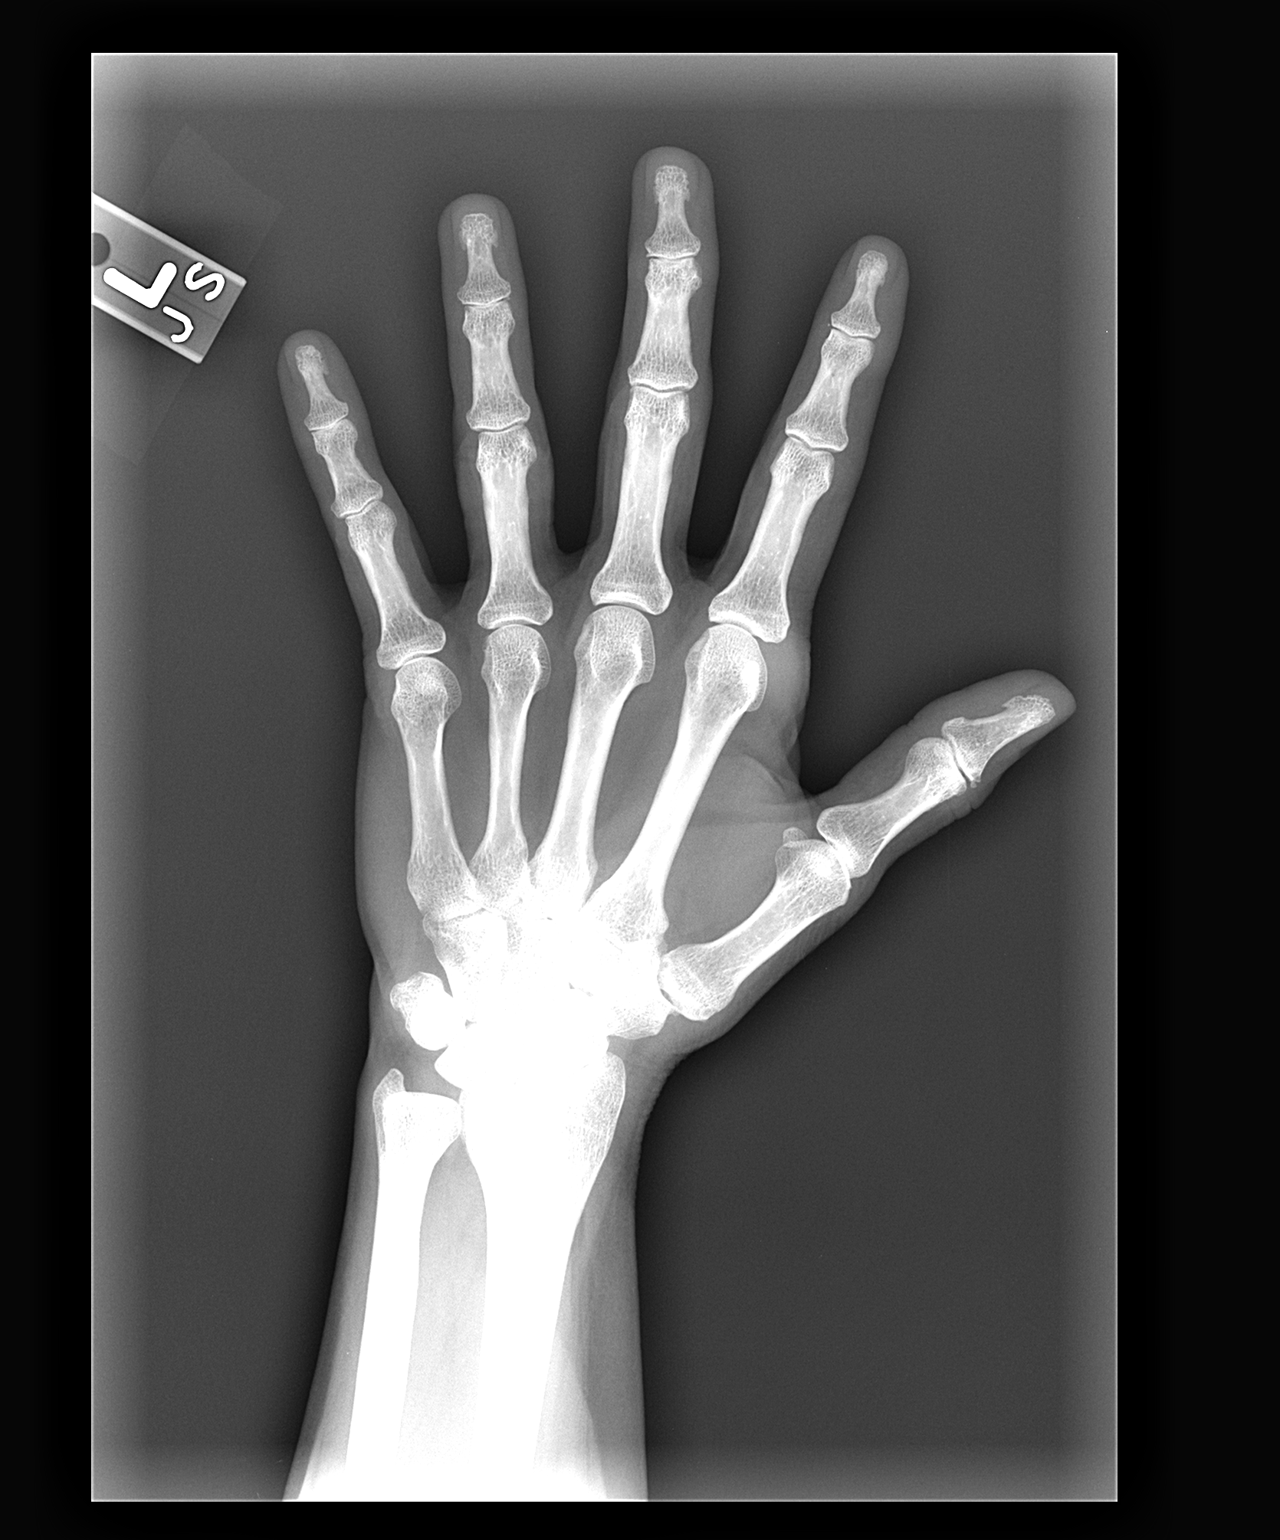

[view not recorded (2 of 3)]
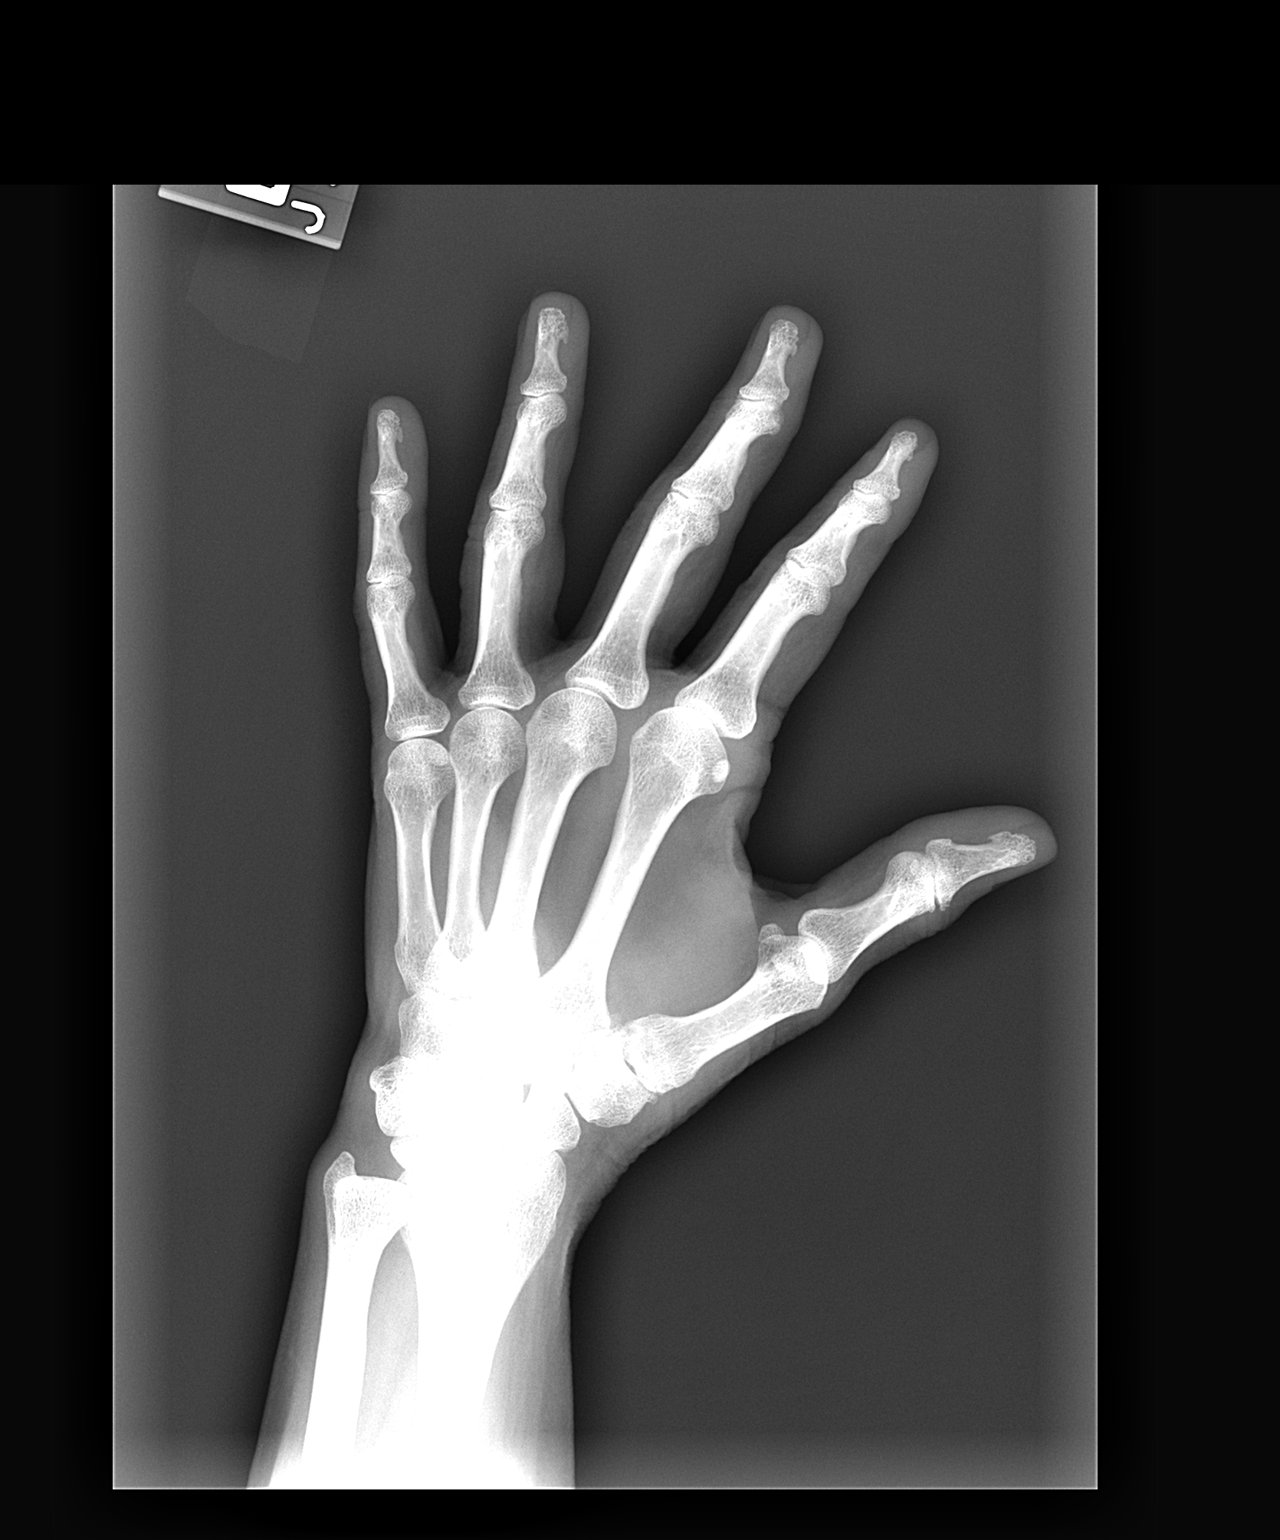

[view not recorded (3 of 3)]
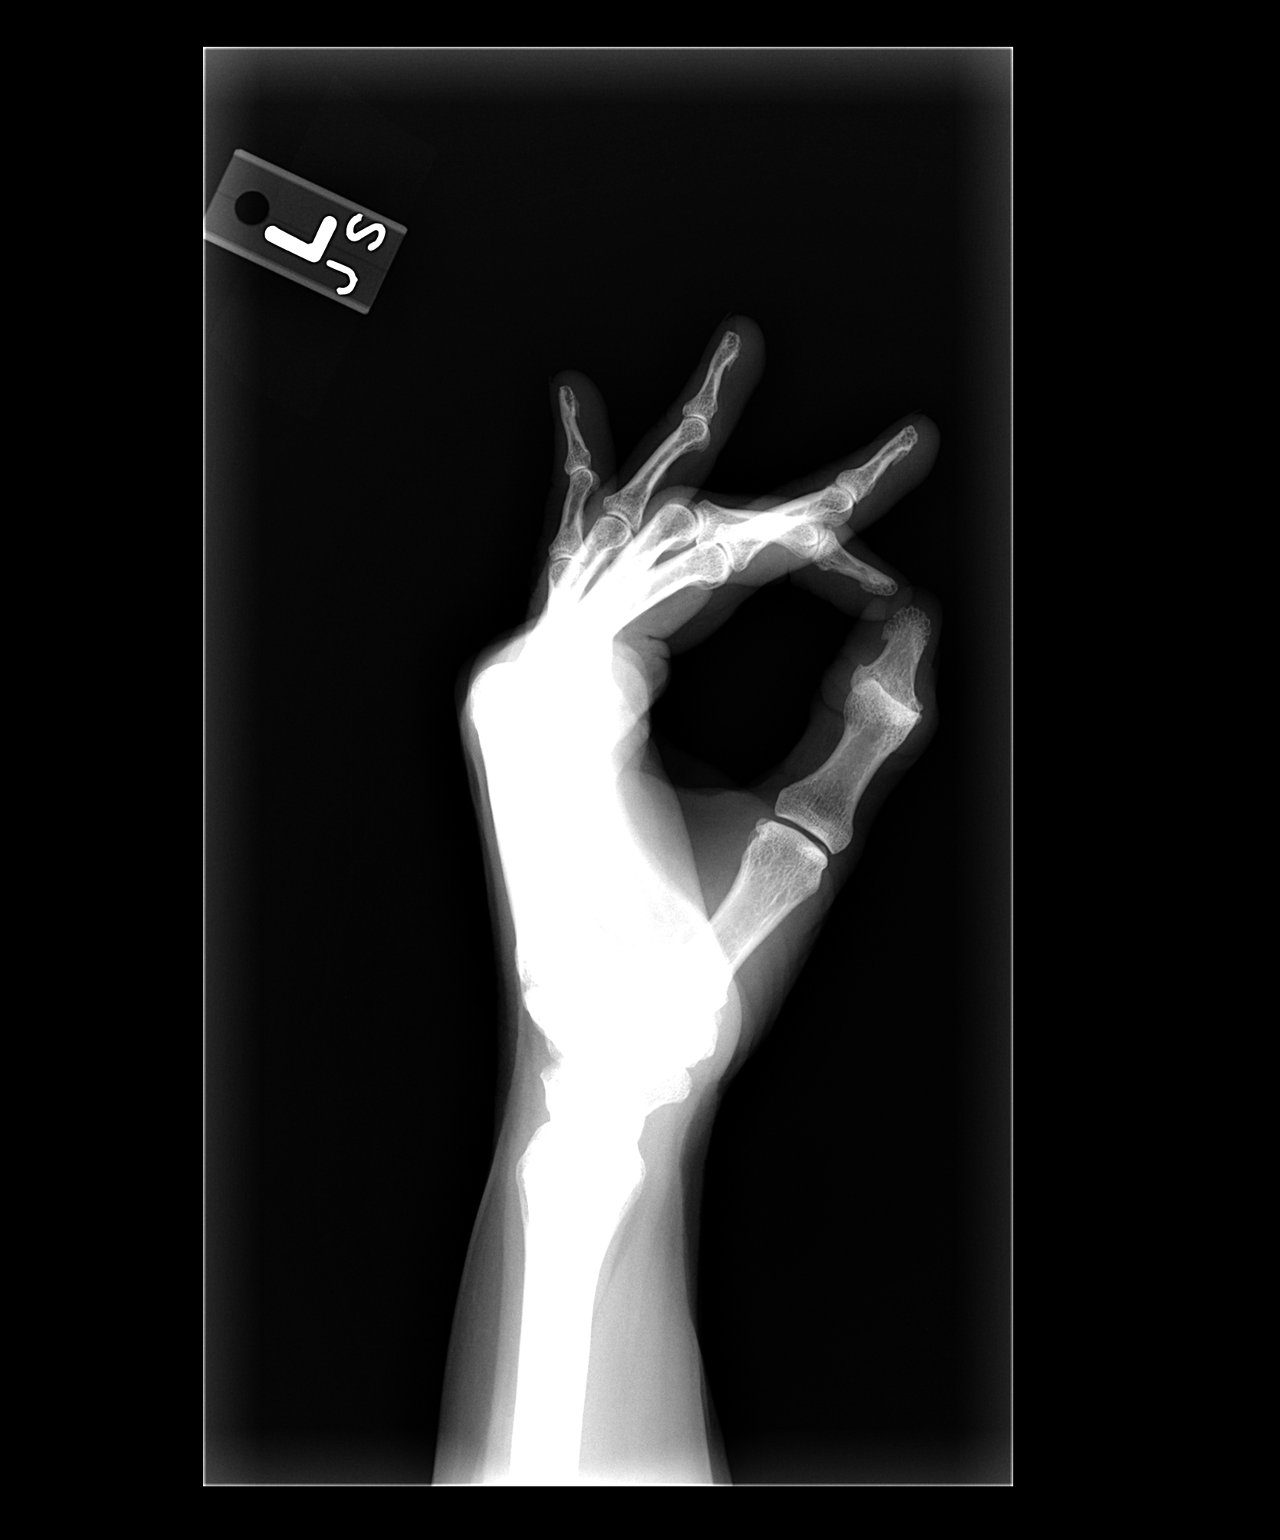

[3 of 3 positions shown; findings below may reference images not displayed]

FINDINGS: There is focal soft tissue swelling along the second
metacarpal phalangeal joint.  No underlying osseous erosion to
suggest osteomyelitis.  No acute osseous abnormality.
IMPRESSION: No evidence of osteomyelitis.

## 2011-07-02 ENCOUNTER — Other Ambulatory Visit (INDEPENDENT_AMBULATORY_CARE_PROVIDER_SITE_OTHER): Payer: BC Managed Care – PPO

## 2011-07-02 ENCOUNTER — Encounter: Payer: Self-pay | Admitting: Internal Medicine

## 2011-07-02 ENCOUNTER — Ambulatory Visit (INDEPENDENT_AMBULATORY_CARE_PROVIDER_SITE_OTHER): Payer: BC Managed Care – PPO | Admitting: Internal Medicine

## 2011-07-02 VITALS — BP 148/88 | HR 88 | Temp 98.0°F | Resp 16

## 2011-07-02 DIAGNOSIS — I9589 Other hypotension: Secondary | ICD-10-CM

## 2011-07-02 DIAGNOSIS — R221 Localized swelling, mass and lump, neck: Secondary | ICD-10-CM | POA: Insufficient documentation

## 2011-07-02 DIAGNOSIS — R220 Localized swelling, mass and lump, head: Secondary | ICD-10-CM

## 2011-07-02 DIAGNOSIS — L570 Actinic keratosis: Secondary | ICD-10-CM

## 2011-07-02 LAB — TSH: TSH: 1 u[IU]/mL (ref 0.35–5.50)

## 2011-07-02 LAB — BASIC METABOLIC PANEL
BUN: 13 mg/dL (ref 6–23)
Calcium: 9.1 mg/dL (ref 8.4–10.5)
Chloride: 101 mEq/L (ref 96–112)
Creatinine, Ser: 0.9 mg/dL (ref 0.4–1.2)
GFR: 71.46 mL/min (ref >60.00)

## 2011-07-02 NOTE — Progress Notes (Signed)
Patient ID: [name], female   DOB: 08-Sep-1955, 56 y.o.   MRN: [id number]  Subjective:    Patient ID: [name], female    DOB: 10-28-1955, 56 y.o.   MRN: [id number]  HPI   The patient is here to follow up on chronic adrenal depression, anxiety, headaches and chronic moderate OA symptoms controlled with medicines, diet and exercise.  C/o R neck sensation x wks - remote h/o gland removal C/o skin lesions on face, neck   Review of Systems  Constitutional: Negative for chills, activity change, appetite change, fatigue and unexpected weight change.  HENT: Positive for neck stiffness. Negative for mouth sores and sinus pressure.   Eyes: Negative for visual disturbance.  Respiratory: Negative for cough, choking, chest tightness, shortness of breath, wheezing and stridor.   Gastrointestinal: Negative for nausea.  Genitourinary: Negative for frequency, difficulty urinating and vaginal pain.  Musculoskeletal: Positive for arthralgias and gait problem. Negative for back pain.  Skin: Negative for pallor and rash.  Neurological: Negative for dizziness, tremors, weakness and numbness.  Psychiatric/Behavioral: Negative for confusion and sleep disturbance.       Objective:   Physical Exam  Constitutional: She appears well-developed. No distress.       Obese  HENT:  Head: Normocephalic.  Right Ear: External ear normal.  Left Ear: External ear normal.  Nose: Nose normal.  Mouth/Throat: Oropharynx is clear and moist.  Eyes: Conjunctivae are normal. Pupils are equal, round, and reactive to light. Right eye exhibits no discharge. Left eye exhibits no discharge.  Neck: Normal range of motion. Neck supple. No JVD present. No tracheal deviation present. No thyromegaly present.       Old scar R neck  Cardiovascular: Normal rate, regular rhythm and normal heart sounds.   Pulmonary/Chest: No stridor. No respiratory distress. She has no wheezes.  Abdominal: Soft. Bowel sounds are  normal. She exhibits no distension and no mass. There is no tenderness. There is no rebound and no guarding.  Musculoskeletal: She exhibits tenderness (B knees). She exhibits no edema.  Lymphadenopathy:    She has no cervical adenopathy.  Neurological: She displays normal reflexes. No cranial nerve deficit. She exhibits normal muscle tone. Coordination normal.  Skin: No rash noted. No erythema.  Psychiatric: She has a normal mood and affect. Her behavior is normal. Judgment and thought content normal.   AKs x 5 - see below  Lab Results  Component Value Date   WBC 10.1 12/29/2010   HGB 13.9 12/29/2010   HCT 41.3 12/29/2010   PLT 268.0 12/29/2010   GLUCOSE 80 12/29/2010   CHOL 197 01/07/2010   TRIG 123.0 01/07/2010   HDL 89.90 01/07/2010   LDLCALC 83 01/07/2010   ALT 22 12/29/2010   AST 22 12/29/2010   NA 138 12/29/2010   K 4.0 12/29/2010   CL 103 12/29/2010   CREATININE 0.7 12/29/2010   BUN 10 12/29/2010   CO2 24 12/29/2010   TSH 0.68 12/29/2010   INR 0.96 07/03/2009    Procedure Note :     Procedure : Cryosurgery   Indication:    Actinic keratosis(es) x5   Risks including unsuccessful procedure , bleeding, infection, bruising, scar, a need for a repeat  procedure and others were explained to the patient in detail as well as the benefits. Informed consent was obtained verbally.   5  lesion(s)  on   Face, neck, chest was/were treated with liquid nitrogen on a Q-tip in a usual fasion . Band-Aid was  applied and antibiotic ointment was given for a later use.   Tolerated well. Complications none.   Postprocedure instructions :     Keep the wounds clean. You can wash them with liquid soap and water. Pat dry with gauze or a Kleenex tissue  Before applying antibiotic ointment and a Band-Aid.   You need to report immediately  if  any signs of infection develop.         Assessment & Plan:

## 2011-07-02 NOTE — Assessment & Plan Note (Signed)
Cryo x5 

## 2011-07-02 NOTE — Assessment & Plan Note (Signed)
R sided "smothering sensation", positional -- ?etiol ENT cons

## 2011-07-02 NOTE — Assessment & Plan Note (Signed)
Continue with current prescription therapy as reflected on the Med list.  

## 2011-07-14 ENCOUNTER — Telehealth: Payer: Self-pay | Admitting: Internal Medicine

## 2011-07-14 NOTE — Telephone Encounter (Signed)
Caller: [name]/Patient; PCP: Plotnikov, Alex; CB#: [phone]; Call regarding Cough/Congestion; Patient reports she has been attempting to treat this at home; coughing up "chunks of green stuff"; Afebrile currently. Patient reports sharp/burning pain that gets worse with coughing, but is present all the time. Patient has taken 800mg  Ibuprofen for pain and fever this morning. Emergent s/s of "Sharp, fleeting chest pain only occuring with deep breath or cough and not responsive to home care" positive per Cough - Adult guideline.

## 2011-07-16 NOTE — Telephone Encounter (Signed)
Has it been addressed/re-routed by another MD? I'm out of town this week? Thx

## 2011-08-13 ENCOUNTER — Other Ambulatory Visit: Payer: Self-pay | Admitting: Internal Medicine

## 2011-10-04 ENCOUNTER — Encounter: Payer: Self-pay | Admitting: Internal Medicine

## 2011-10-04 ENCOUNTER — Ambulatory Visit (INDEPENDENT_AMBULATORY_CARE_PROVIDER_SITE_OTHER): Payer: BC Managed Care – PPO | Admitting: Internal Medicine

## 2011-10-04 VITALS — BP 130/80 | HR 74 | Temp 97.1°F | Resp 15

## 2011-10-04 DIAGNOSIS — E538 Deficiency of other specified B group vitamins: Secondary | ICD-10-CM

## 2011-10-04 DIAGNOSIS — K219 Gastro-esophageal reflux disease without esophagitis: Secondary | ICD-10-CM

## 2011-10-04 MED ORDER — OXYCODONE-ACETAMINOPHEN 5-325 MG PO TABS: 1.0000 | ORAL_TABLET | Freq: Three times a day (TID) | ORAL | Status: AC | PRN

## 2011-10-04 NOTE — Assessment & Plan Note (Signed)
Continue with current prescription therapy as reflected on the Med list.  

## 2011-10-04 NOTE — Progress Notes (Signed)
  Subjective:    Patient ID: [name], female    DOB: [birth year], 56 y.o.   MRN: [id number]  HPI   The patient is here to follow up on chronic adrenal depression, anxiety, headaches and chronic moderate OA symptoms controlled with medicines, diet and exercise. C/o severe R shoulder pain - ortho appt on Thur this wk C/o R neck sensation x wks - remote h/o gland removal  Wt Readings from Last 3 Encounters:  05/07/09 165 lb (74.844 kg)  06/04/08 163 lb (73.936 kg)  10/25/07 159 lb (72.122 kg)    BP Readings from Last 3 Encounters:  10/04/11 130/80  07/02/11 148/88  01/01/11 130/78     Review of Systems  Constitutional: Negative for chills, activity change, appetite change, fatigue and unexpected weight change.  HENT: Positive for neck stiffness. Negative for mouth sores and sinus pressure.   Eyes: Negative for visual disturbance.  Respiratory: Negative for cough, choking, chest tightness, shortness of breath, wheezing and stridor.   Gastrointestinal: Negative for nausea.  Genitourinary: Negative for frequency, difficulty urinating and vaginal pain.  Musculoskeletal: Positive for arthralgias and gait problem. Negative for back pain.  Skin: Negative for pallor and rash.  Neurological: Negative for dizziness, tremors, weakness and numbness.  Psychiatric/Behavioral: Negative for confusion and disturbed wake/sleep cycle.       Objective:   Physical Exam  Constitutional: She appears well-developed. No distress.       Obese  HENT:  Head: Normocephalic.  Right Ear: External ear normal.  Left Ear: External ear normal.  Nose: Nose normal.  Mouth/Throat: Oropharynx is clear and moist.  Eyes: Conjunctivae are normal. Pupils are equal, round, and reactive to light. Right eye exhibits no discharge. Left eye exhibits no discharge.  Neck: Normal range of motion. Neck supple. No JVD present. No tracheal deviation present. No thyromegaly present.       Old scar R neck    Cardiovascular: Normal rate, regular rhythm and normal heart sounds.   Pulmonary/Chest: No stridor. No respiratory distress. She has no wheezes.  Abdominal: Soft. Bowel sounds are normal. She exhibits no distension and no mass. There is no tenderness. There is no rebound and no guarding.  Musculoskeletal: She exhibits tenderness (B knees). She exhibits no edema.  Lymphadenopathy:    She has no cervical adenopathy.  Neurological: She displays normal reflexes. No cranial nerve deficit. She exhibits normal muscle tone. Coordination normal.  Skin: No rash noted. No erythema.  Psychiatric: She has a normal mood and affect. Her behavior is normal. Judgment and thought content normal.     Lab Results  Component Value Date   WBC 10.1 12/29/2010   HGB 13.9 12/29/2010   HCT 41.3 12/29/2010   PLT 268.0 12/29/2010   GLUCOSE 97 07/02/2011   CHOL 197 01/07/2010   TRIG 123.0 01/07/2010   HDL 89.90 01/07/2010   LDLCALC 83 01/07/2010   ALT 22 12/29/2010   AST 22 12/29/2010   NA 136 07/02/2011   K 3.7 07/02/2011   CL 101 07/02/2011   CREATININE 0.9 07/02/2011   BUN 13 07/02/2011   CO2 25 07/02/2011   TSH 1.00 07/02/2011   INR 0.96 07/03/2009         Assessment & Plan:

## 2011-11-11 DIAGNOSIS — G5751 Tarsal tunnel syndrome, right lower limb: Secondary | ICD-10-CM | POA: Insufficient documentation

## 2012-01-04 ENCOUNTER — Other Ambulatory Visit (INDEPENDENT_AMBULATORY_CARE_PROVIDER_SITE_OTHER): Payer: BC Managed Care – PPO

## 2012-01-04 ENCOUNTER — Ambulatory Visit (INDEPENDENT_AMBULATORY_CARE_PROVIDER_SITE_OTHER): Payer: BC Managed Care – PPO | Admitting: Internal Medicine

## 2012-01-04 ENCOUNTER — Encounter: Payer: Self-pay | Admitting: Internal Medicine

## 2012-01-04 VITALS — BP 130/98 | HR 89 | Temp 97.2°F

## 2012-01-04 DIAGNOSIS — M109 Gout, unspecified: Secondary | ICD-10-CM

## 2012-01-04 DIAGNOSIS — I9589 Other hypotension: Secondary | ICD-10-CM

## 2012-01-04 DIAGNOSIS — Z23 Encounter for immunization: Secondary | ICD-10-CM

## 2012-01-04 DIAGNOSIS — E538 Deficiency of other specified B group vitamins: Secondary | ICD-10-CM

## 2012-01-04 DIAGNOSIS — E278 Other specified disorders of adrenal gland: Secondary | ICD-10-CM

## 2012-01-04 LAB — BASIC METABOLIC PANEL
BUN: 8 mg/dL (ref 6–23)
CO2: 28 mEq/L (ref 19–32)
Calcium: 9.1 mg/dL (ref 8.4–10.5)
Chloride: 103 mEq/L (ref 96–112)
Creatinine, Ser: 0.7 mg/dL (ref 0.4–1.2)
Glucose, Bld: 103 mg/dL — ABNORMAL HIGH (ref 70–99)

## 2012-01-04 LAB — CORTISOL: Cortisol, Plasma: 6.8 ug/dL

## 2012-01-04 LAB — TSH: TSH: 1.02 u[IU]/mL (ref 0.35–5.50)

## 2012-01-04 NOTE — Progress Notes (Signed)
   Subjective:    Patient ID: [name], female    DOB: 05/28/1955, 56 y.o.   MRN: [id number]  HPI   The patient is here to follow up on chronic adrenal depression, anxiety, headaches and chronic moderate OA symptoms controlled with medicines, diet and exercise.  C/o severe R shoulder pain     Wt Readings from Last 3 Encounters:  05/07/09 165 lb (74.844 kg)  06/04/08 163 lb (73.936 kg)  10/25/07 159 lb (72.122 kg)    BP Readings from Last 3 Encounters:  01/04/12 130/98  10/04/11 130/80  07/02/11 148/88     Review of Systems  Constitutional: Negative for chills, activity change, appetite change, fatigue and unexpected weight change.  HENT: Positive for neck stiffness. Negative for mouth sores and sinus pressure.   Eyes: Negative for visual disturbance.  Respiratory: Negative for cough, choking, chest tightness, shortness of breath, wheezing and stridor.   Gastrointestinal: Negative for nausea.  Genitourinary: Negative for frequency, difficulty urinating and vaginal pain.  Musculoskeletal: Positive for arthralgias and gait problem. Negative for back pain.  Skin: Negative for pallor and rash.  Neurological: Negative for dizziness, tremors, weakness and numbness.  Psychiatric/Behavioral: Negative for confusion and sleep disturbance.       Objective:   Physical Exam  Constitutional: She appears well-developed. No distress.       Obese  HENT:  Head: Normocephalic.  Right Ear: External ear normal.  Left Ear: External ear normal.  Nose: Nose normal.  Mouth/Throat: Oropharynx is clear and moist.  Eyes: Conjunctivae normal are normal. Pupils are equal, round, and reactive to light. Right eye exhibits no discharge. Left eye exhibits no discharge.  Neck: Normal range of motion. Neck supple. No JVD present. No tracheal deviation present. No thyromegaly present.       Old scar R neck  Cardiovascular: Normal rate, regular rhythm and normal heart sounds.     Pulmonary/Chest: No stridor. No respiratory distress. She has no wheezes.  Abdominal: Soft. Bowel sounds are normal. She exhibits no distension and no mass. There is no tenderness. There is no rebound and no guarding.  Musculoskeletal: She exhibits tenderness (B knees). She exhibits no edema.  Lymphadenopathy:    She has no cervical adenopathy.  Neurological: She displays normal reflexes. No cranial nerve deficit. She exhibits normal muscle tone. Coordination normal.  Skin: No rash noted. No erythema.  Psychiatric: She has a normal mood and affect. Her behavior is normal. Judgment and thought content normal.     Lab Results  Component Value Date   WBC 10.1 12/29/2010   HGB 13.9 12/29/2010   HCT 41.3 12/29/2010   PLT 268.0 12/29/2010   GLUCOSE 97 07/02/2011   CHOL 197 01/07/2010   TRIG 123.0 01/07/2010   HDL 89.90 01/07/2010   LDLCALC 83 01/07/2010   ALT 22 12/29/2010   AST 22 12/29/2010   NA 136 07/02/2011   K 3.7 07/02/2011   CL 101 07/02/2011   CREATININE 0.9 07/02/2011   BUN 13 07/02/2011   CO2 25 07/02/2011   TSH 1.00 07/02/2011   INR 0.96 07/03/2009         Assessment & Plan:

## 2012-01-04 NOTE — Assessment & Plan Note (Signed)
Continue with current prescription therapy as reflected on the Med list.  

## 2012-01-04 NOTE — Assessment & Plan Note (Signed)
Chronic. 

## 2012-01-04 NOTE — Assessment & Plan Note (Signed)
BP is ok at home 

## 2012-02-05 ENCOUNTER — Other Ambulatory Visit: Payer: Self-pay | Admitting: Internal Medicine

## 2012-02-12 ENCOUNTER — Other Ambulatory Visit: Payer: Self-pay | Admitting: Internal Medicine

## 2012-02-21 DIAGNOSIS — M67449 Ganglion, unspecified hand: Secondary | ICD-10-CM | POA: Insufficient documentation

## 2012-05-03 ENCOUNTER — Encounter: Payer: Self-pay | Admitting: Internal Medicine

## 2012-05-03 ENCOUNTER — Ambulatory Visit (INDEPENDENT_AMBULATORY_CARE_PROVIDER_SITE_OTHER): Payer: 59 | Admitting: Internal Medicine

## 2012-05-03 ENCOUNTER — Other Ambulatory Visit (INDEPENDENT_AMBULATORY_CARE_PROVIDER_SITE_OTHER): Payer: 59

## 2012-05-03 VITALS — BP 130/90 | HR 80 | Temp 97.9°F | Resp 16

## 2012-05-03 DIAGNOSIS — K219 Gastro-esophageal reflux disease without esophagitis: Secondary | ICD-10-CM

## 2012-05-03 DIAGNOSIS — M109 Gout, unspecified: Secondary | ICD-10-CM

## 2012-05-03 DIAGNOSIS — E278 Other specified disorders of adrenal gland: Secondary | ICD-10-CM

## 2012-05-03 DIAGNOSIS — E538 Deficiency of other specified B group vitamins: Secondary | ICD-10-CM

## 2012-05-03 DIAGNOSIS — R079 Chest pain, unspecified: Secondary | ICD-10-CM

## 2012-05-03 DIAGNOSIS — I9589 Other hypotension: Secondary | ICD-10-CM

## 2012-05-03 DIAGNOSIS — J019 Acute sinusitis, unspecified: Secondary | ICD-10-CM | POA: Insufficient documentation

## 2012-05-03 LAB — TSH: TSH: 1.08 u[IU]/mL (ref 0.35–5.50)

## 2012-05-03 LAB — BASIC METABOLIC PANEL
Calcium: 9.4 mg/dL (ref 8.4–10.5)
GFR: 83.28 mL/min (ref >60.00)
Glucose, Bld: 122 mg/dL — ABNORMAL HIGH (ref 70–99)
Sodium: 139 mEq/L (ref 135–145)

## 2012-05-03 MED ORDER — VITAMIN B-12 1000 MCG SL SUBL: SUBLINGUAL_TABLET | SUBLINGUAL | Status: DC

## 2012-05-03 MED ORDER — AMOXICILLIN 500 MG PO CAPS: 1000.0000 mg | ORAL_CAPSULE | Freq: Two times a day (BID) | ORAL | Status: DC

## 2012-05-03 NOTE — Assessment & Plan Note (Signed)
No relapse 

## 2012-05-03 NOTE — Progress Notes (Signed)
   Subjective:    HPI   The patient is here to follow up on chronic adrenal depression, anxiety, headaches and chronic moderate OA symptoms controlled with medicines, diet and exercise.  C/o severe R shoulder pain     Wt Readings from Last 3 Encounters:  05/07/09 165 lb (74.844 kg)  06/04/08 163 lb (73.936 kg)  10/25/07 159 lb (72.122 kg)    BP Readings from Last 3 Encounters:  05/03/12 130/90  01/04/12 130/98  10/04/11 130/80     Review of Systems  Constitutional: Negative for chills, activity change, appetite change, fatigue and unexpected weight change.  HENT: Positive for neck stiffness. Negative for mouth sores and sinus pressure.   Eyes: Negative for visual disturbance.  Respiratory: Negative for cough, choking, chest tightness, shortness of breath, wheezing and stridor.   Gastrointestinal: Negative for nausea.  Genitourinary: Negative for frequency, difficulty urinating and vaginal pain.  Musculoskeletal: Positive for arthralgias and gait problem. Negative for back pain.  Skin: Negative for pallor and rash.  Neurological: Negative for dizziness, tremors, weakness and numbness.  Psychiatric/Behavioral: Negative for confusion and sleep disturbance.       Objective:   Physical Exam  Constitutional: She appears well-developed. No distress.  Obese  HENT:  Head: Normocephalic.  Right Ear: External ear normal.  Left Ear: External ear normal.  Nose: Nose normal.  Mouth/Throat: Oropharynx is clear and moist.  Eyes: Conjunctivae are normal. Pupils are equal, round, and reactive to light. Right eye exhibits no discharge. Left eye exhibits no discharge.  Neck: Normal range of motion. Neck supple. No JVD present. No tracheal deviation present. No thyromegaly present.  Old scar R neck  Cardiovascular: Normal rate, regular rhythm and normal heart sounds.   Pulmonary/Chest: No stridor. No respiratory distress. She has no wheezes.  Abdominal: Soft. Bowel sounds are  normal. She exhibits no distension and no mass. There is no tenderness. There is no rebound and no guarding.  Musculoskeletal: She exhibits tenderness (B knees). She exhibits no edema.  Lymphadenopathy:    She has no cervical adenopathy.  Neurological: She displays normal reflexes. No cranial nerve deficit. She exhibits normal muscle tone. Coordination normal.  Skin: No rash noted. No erythema.  Psychiatric: She has a normal mood and affect. Her behavior is normal. Judgment and thought content normal.     Lab Results  Component Value Date   WBC 10.1 12/29/2010   HGB 13.9 12/29/2010   HCT 41.3 12/29/2010   PLT 268.0 12/29/2010   GLUCOSE 103* 01/04/2012   CHOL 197 01/07/2010   TRIG 123.0 01/07/2010   HDL 89.90 01/07/2010   LDLCALC 83 01/07/2010   ALT 22 12/29/2010   AST 22 12/29/2010   NA 139 01/04/2012   K 3.7 01/04/2012   CL 103 01/04/2012   CREATININE 0.7 01/04/2012   BUN 8 01/04/2012   CO2 28 01/04/2012   TSH 1.02 01/04/2012   INR 0.96 07/03/2009         Assessment & Plan:

## 2012-05-03 NOTE — Assessment & Plan Note (Signed)
Continue with current prescription therapy as reflected on the Med list.  

## 2012-08-07 ENCOUNTER — Other Ambulatory Visit: Payer: Self-pay | Admitting: Internal Medicine

## 2012-08-08 ENCOUNTER — Other Ambulatory Visit: Payer: Self-pay | Admitting: Internal Medicine

## 2012-08-16 DIAGNOSIS — M79646 Pain in unspecified finger(s): Secondary | ICD-10-CM | POA: Insufficient documentation

## 2012-09-01 ENCOUNTER — Encounter: Payer: Self-pay | Admitting: Internal Medicine

## 2012-09-01 ENCOUNTER — Ambulatory Visit (INDEPENDENT_AMBULATORY_CARE_PROVIDER_SITE_OTHER): Payer: 59 | Admitting: Internal Medicine

## 2012-09-01 ENCOUNTER — Other Ambulatory Visit (INDEPENDENT_AMBULATORY_CARE_PROVIDER_SITE_OTHER): Payer: 59

## 2012-09-01 VITALS — BP 130/78 | HR 89 | Temp 97.8°F

## 2012-09-01 DIAGNOSIS — E559 Vitamin D deficiency, unspecified: Secondary | ICD-10-CM

## 2012-09-01 DIAGNOSIS — R7309 Other abnormal glucose: Secondary | ICD-10-CM

## 2012-09-01 DIAGNOSIS — M1990 Unspecified osteoarthritis, unspecified site: Secondary | ICD-10-CM

## 2012-09-01 DIAGNOSIS — E538 Deficiency of other specified B group vitamins: Secondary | ICD-10-CM

## 2012-09-01 DIAGNOSIS — I9589 Other hypotension: Secondary | ICD-10-CM

## 2012-09-01 DIAGNOSIS — R739 Hyperglycemia, unspecified: Secondary | ICD-10-CM

## 2012-09-01 DIAGNOSIS — M109 Gout, unspecified: Secondary | ICD-10-CM

## 2012-09-01 LAB — BASIC METABOLIC PANEL
CO2: 26 mEq/L (ref 19–32)
Calcium: 9.2 mg/dL (ref 8.4–10.5)
GFR: 85.78 mL/min (ref >60.00)
Potassium: 3.4 mEq/L — ABNORMAL LOW (ref 3.5–5.1)
Sodium: 138 mEq/L (ref 135–145)

## 2012-09-01 LAB — VITAMIN B12: Vitamin B-12: 390 pg/mL (ref 211–911)

## 2012-09-01 MED ORDER — VITAMIN B-12 1000 MCG SL SUBL: SUBLINGUAL_TABLET | SUBLINGUAL | Status: DC

## 2012-09-01 MED ORDER — CHOLECALCIFEROL 25 MCG (1000 UT) PO TABS: 1000.0000 [IU] | ORAL_TABLET | Freq: Every day | ORAL | Status: DC

## 2012-09-01 MED ORDER — HYDROCORTISONE 5 MG PO TABS: 5.0000 mg | ORAL_TABLET | Freq: Every day | ORAL | Status: DC

## 2012-09-01 NOTE — Assessment & Plan Note (Signed)
No relapse 

## 2012-09-01 NOTE — Assessment & Plan Note (Signed)
Continue with current prescription therapy as reflected on the Med list.  

## 2012-09-01 NOTE — Assessment & Plan Note (Signed)
2014 Labs

## 2012-09-01 NOTE — Progress Notes (Signed)
   Subjective:    HPI   The patient is here to follow up on chronic adrenal depression, anxiety, headaches and chronic moderate OA symptoms controlled with medicines, diet and exercise.  F/u elev glu    Wt Readings from Last 3 Encounters:  05/07/09 165 lb (74.844 kg)  06/04/08 163 lb (73.936 kg)  10/25/07 159 lb (72.122 kg)    BP Readings from Last 3 Encounters:  09/01/12 130/78  05/03/12 130/90  01/04/12 130/98     Review of Systems  Constitutional: Negative for chills, activity change, appetite change, fatigue and unexpected weight change.  HENT: Positive for neck stiffness. Negative for mouth sores and sinus pressure.   Eyes: Negative for visual disturbance.  Respiratory: Negative for cough, choking, chest tightness, shortness of breath, wheezing and stridor.   Gastrointestinal: Negative for nausea.  Genitourinary: Negative for frequency, difficulty urinating and vaginal pain.  Musculoskeletal: Positive for arthralgias and gait problem. Negative for back pain.  Skin: Negative for pallor and rash.  Neurological: Negative for dizziness, tremors, weakness and numbness.  Psychiatric/Behavioral: Negative for confusion and sleep disturbance.       Objective:   Physical Exam  Constitutional: She appears well-developed. No distress.  Obese  HENT:  Head: Normocephalic.  Right Ear: External ear normal.  Left Ear: External ear normal.  Nose: Nose normal.  Mouth/Throat: Oropharynx is clear and moist.  Eyes: Conjunctivae are normal. Pupils are equal, round, and reactive to light. Right eye exhibits no discharge. Left eye exhibits no discharge.  Neck: Normal range of motion. Neck supple. No JVD present. No tracheal deviation present. No thyromegaly present.  Old scar R neck  Cardiovascular: Normal rate, regular rhythm and normal heart sounds.   Pulmonary/Chest: No stridor. No respiratory distress. She has no wheezes.  Abdominal: Soft. Bowel sounds are normal. She exhibits  no distension and no mass. There is no tenderness. There is no rebound and no guarding.  Musculoskeletal: She exhibits tenderness (B knees). She exhibits no edema.  Lymphadenopathy:    She has no cervical adenopathy.  Neurological: She displays normal reflexes. No cranial nerve deficit. She exhibits normal muscle tone. Coordination normal.  Skin: No rash noted. No erythema.  Psychiatric: She has a normal mood and affect. Her behavior is normal. Judgment and thought content normal.     Lab Results  Component Value Date   WBC 10.1 12/29/2010   HGB 13.9 12/29/2010   HCT 41.3 12/29/2010   PLT 268.0 12/29/2010   GLUCOSE 122* 05/03/2012   CHOL 197 01/07/2010   TRIG 123.0 01/07/2010   HDL 89.90 01/07/2010   LDLCALC 83 01/07/2010   ALT 22 12/29/2010   AST 22 12/29/2010   NA 139 05/03/2012   K 3.7 05/03/2012   CL 104 05/03/2012   CREATININE 0.8 05/03/2012   BUN 12 05/03/2012   CO2 26 05/03/2012   TSH 1.08 05/03/2012   INR 0.96 07/03/2009         Assessment & Plan:

## 2012-09-02 LAB — VITAMIN D 25 HYDROXY (VIT D DEFICIENCY, FRACTURES): Vit D, 25-Hydroxy: 41 ng/mL (ref 30–89)

## 2012-09-10 ENCOUNTER — Encounter: Payer: Self-pay | Admitting: Internal Medicine

## 2012-09-19 ENCOUNTER — Encounter: Payer: Self-pay | Admitting: Internal Medicine

## 2012-09-19 ENCOUNTER — Other Ambulatory Visit: Payer: Self-pay | Admitting: Internal Medicine

## 2012-09-19 MED ORDER — AMOXICILLIN 500 MG PO CAPS: 1000.0000 mg | ORAL_CAPSULE | Freq: Two times a day (BID) | ORAL | Status: DC

## 2012-12-05 ENCOUNTER — Telehealth: Payer: Self-pay | Admitting: Internal Medicine

## 2012-12-05 NOTE — Telephone Encounter (Signed)
12/05/2012   Pt has a question.  Please contact.

## 2012-12-06 ENCOUNTER — Encounter: Payer: Self-pay | Admitting: Internal Medicine

## 2012-12-08 NOTE — Telephone Encounter (Signed)
I called pt on 10/23 at lunch and left a VM

## 2012-12-12 ENCOUNTER — Encounter: Payer: Self-pay | Admitting: Internal Medicine

## 2012-12-14 ENCOUNTER — Other Ambulatory Visit: Payer: Self-pay | Admitting: Internal Medicine

## 2012-12-15 ENCOUNTER — Other Ambulatory Visit: Payer: Self-pay | Admitting: Internal Medicine

## 2012-12-15 MED ORDER — AMOXICILLIN 500 MG PO CAPS: 1000.0000 mg | ORAL_CAPSULE | Freq: Two times a day (BID) | ORAL | Status: DC

## 2012-12-21 ENCOUNTER — Other Ambulatory Visit: Payer: Self-pay

## 2012-12-22 DIAGNOSIS — M7712 Lateral epicondylitis, left elbow: Secondary | ICD-10-CM | POA: Insufficient documentation

## 2012-12-22 DIAGNOSIS — G5632 Lesion of radial nerve, left upper limb: Secondary | ICD-10-CM | POA: Insufficient documentation

## 2012-12-22 DIAGNOSIS — S5002XA Contusion of left elbow, initial encounter: Secondary | ICD-10-CM | POA: Insufficient documentation

## 2013-01-03 ENCOUNTER — Ambulatory Visit: Payer: 59 | Admitting: Internal Medicine

## 2013-01-05 ENCOUNTER — Encounter: Payer: Self-pay | Admitting: Internal Medicine

## 2013-01-12 ENCOUNTER — Other Ambulatory Visit (INDEPENDENT_AMBULATORY_CARE_PROVIDER_SITE_OTHER): Payer: 59

## 2013-01-12 ENCOUNTER — Ambulatory Visit (INDEPENDENT_AMBULATORY_CARE_PROVIDER_SITE_OTHER): Payer: 59 | Admitting: Internal Medicine

## 2013-01-12 ENCOUNTER — Encounter: Payer: Self-pay | Admitting: Internal Medicine

## 2013-01-12 VITALS — BP 144/96 | HR 84 | Temp 97.9°F | Resp 16

## 2013-01-12 DIAGNOSIS — E538 Deficiency of other specified B group vitamins: Secondary | ICD-10-CM

## 2013-01-12 DIAGNOSIS — I9589 Other hypotension: Secondary | ICD-10-CM

## 2013-01-12 DIAGNOSIS — E871 Hypo-osmolality and hyponatremia: Secondary | ICD-10-CM

## 2013-01-12 DIAGNOSIS — R7309 Other abnormal glucose: Secondary | ICD-10-CM

## 2013-01-12 DIAGNOSIS — R739 Hyperglycemia, unspecified: Secondary | ICD-10-CM

## 2013-01-12 DIAGNOSIS — R5381 Other malaise: Secondary | ICD-10-CM

## 2013-01-12 DIAGNOSIS — L570 Actinic keratosis: Secondary | ICD-10-CM

## 2013-01-12 DIAGNOSIS — Z23 Encounter for immunization: Secondary | ICD-10-CM

## 2013-01-12 LAB — HEPATIC FUNCTION PANEL
Alkaline Phosphatase: 56 U/L (ref 39–117)
Bilirubin, Direct: 0.1 mg/dL (ref 0.0–0.3)
Total Protein: 7 g/dL (ref 6.0–8.3)

## 2013-01-12 LAB — CBC WITH DIFFERENTIAL/PLATELET
Basophils Absolute: 0 10*3/uL (ref 0.0–0.1)
Basophils Relative: 0.3 % (ref 0.0–3.0)
Eosinophils Absolute: 0.2 10*3/uL (ref 0.0–0.7)
HCT: 41 % (ref 36.0–46.0)
Hemoglobin: 13.9 g/dL (ref 12.0–15.0)
Lymphocytes Relative: 20.2 % (ref 12.0–46.0)
Lymphs Abs: 2.2 10*3/uL (ref 0.7–4.0)
MCHC: 33.9 g/dL (ref 30.0–36.0)
Monocytes Absolute: 0.5 10*3/uL (ref 0.1–1.0)
Neutro Abs: 7.9 10*3/uL — ABNORMAL HIGH (ref 1.4–7.7)
Platelets: 291 10*3/uL (ref 150.0–400.0)
RDW: 12.4 % (ref 11.5–14.6)

## 2013-01-12 LAB — TSH: TSH: 1.19 u[IU]/mL (ref 0.35–5.50)

## 2013-01-12 LAB — BASIC METABOLIC PANEL
CO2: 26 mEq/L (ref 19–32)
GFR: 88.42 mL/min (ref >60.00)
Glucose, Bld: 99 mg/dL (ref 70–99)
Potassium: 3.8 mEq/L (ref 3.5–5.1)
Sodium: 136 mEq/L (ref 135–145)

## 2013-01-12 NOTE — Patient Instructions (Signed)
   Postprocedure instructions :     Keep the wounds clean. You can wash them with liquid soap and water. Pat dry with gauze or a Kleenex tissue  Before applying antibiotic ointment and a Band-Aid.   You need to report immediately  if  any signs of infection develop.    

## 2013-01-12 NOTE — Progress Notes (Signed)
Subjective:    HPI   The patient is here to follow up on chronic adrenal depression, anxiety, headaches and chronic moderate OA symptoms controlled with medicines, diet and exercise.  F/u elev glu  [name] fell and tore her L triceps tendon -  Dr Nedra Hai at [facility] will repair it on 01/30/13  C/o fatigue and stress...    Wt Readings from Last 3 Encounters:  05/07/09 165 lb (74.844 kg)  06/04/08 163 lb (73.936 kg)  10/25/07 159 lb (72.122 kg)    BP Readings from Last 3 Encounters:  01/12/13 144/96  09/01/12 130/78  05/03/12 130/90     Review of Systems  Constitutional: Negative for chills, activity change, appetite change, fatigue and unexpected weight change.  HENT: Negative for mouth sores and sinus pressure.   Eyes: Negative for visual disturbance.  Respiratory: Negative for cough, choking, chest tightness, shortness of breath, wheezing and stridor.   Gastrointestinal: Negative for nausea.  Genitourinary: Negative for frequency, difficulty urinating and vaginal pain.  Musculoskeletal: Positive for arthralgias, gait problem and neck stiffness. Negative for back pain.  Skin: Negative for pallor and rash.  Neurological: Negative for dizziness, tremors, weakness and numbness.  Psychiatric/Behavioral: Negative for confusion and sleep disturbance.       Objective:   Physical Exam  Constitutional: She appears well-developed. No distress.  Obese  HENT:  Head: Normocephalic.  Right Ear: External ear normal.  Left Ear: External ear normal.  Nose: Nose normal.  Mouth/Throat: Oropharynx is clear and moist.  Eyes: Conjunctivae are normal. Pupils are equal, round, and reactive to light. Right eye exhibits no discharge. Left eye exhibits no discharge.  Neck: Normal range of motion. Neck supple. No JVD present. No tracheal deviation present. No thyromegaly present.  Old scar R neck  Cardiovascular: Normal rate, regular rhythm and normal heart sounds.   Pulmonary/Chest: No  stridor. No respiratory distress. She has no wheezes.  Abdominal: Soft. Bowel sounds are normal. She exhibits no distension and no mass. There is no tenderness. There is no rebound and no guarding.  Musculoskeletal: She exhibits tenderness (B knees). She exhibits no edema.  Lymphadenopathy:    She has no cervical adenopathy.  Neurological: She displays normal reflexes. No cranial nerve deficit. She exhibits normal muscle tone. Coordination normal.  Skin: No rash noted. No erythema.  Psychiatric: She has a normal mood and affect. Her behavior is normal. Judgment and thought content normal.  AK R upper forehead x 2 L dist triceps is tender   Lab Results  Component Value Date   WBC 10.1 12/29/2010   HGB 13.9 12/29/2010   HCT 41.3 12/29/2010   PLT 268.0 12/29/2010   GLUCOSE 116* 09/01/2012   CHOL 197 01/07/2010   TRIG 123.0 01/07/2010   HDL 89.90 01/07/2010   LDLCALC 83 01/07/2010   ALT 22 12/29/2010   AST 22 12/29/2010   NA 138 09/01/2012   K 3.4* 09/01/2012   CL 105 09/01/2012   CREATININE 0.7 09/01/2012   BUN 11 09/01/2012   CO2 26 09/01/2012   TSH 1.08 05/03/2012   INR 0.96 07/03/2009   HGBA1C 5.8 09/01/2012      Procedure Note :     Procedure : Cryosurgery   Indication:    Actinic keratosis(es) x2 R forehead   Risks including unsuccessful procedure , bleeding, infection, bruising, scar, a need for a repeat  procedure and others were explained to the patient in detail as well as the benefits. Informed consent was obtained verbally.  2 lesion(s)  on R upper forehead  was/were treated with liquid nitrogen on a Q-tip in a usual fasion . Band-Aid was applied and antibiotic ointment was given for a later use.   Tolerated well. Complications none.   Postprocedure instructions :     Keep the wounds clean. You can wash them with liquid soap and water. Pat dry with gauze or a Kleenex tissue  Before applying antibiotic ointment and a Band-Aid.   You need to report immediately  if   any signs of infection develop.       Assessment & Plan:

## 2013-01-12 NOTE — Assessment & Plan Note (Signed)
A1c

## 2013-01-12 NOTE — Assessment & Plan Note (Signed)
Cryo - see procedure 

## 2013-01-12 NOTE — Progress Notes (Signed)
Pre visit review using our clinic review tool, if applicable. No additional management support is needed unless otherwise documented below in the visit note. 

## 2013-01-12 NOTE — Assessment & Plan Note (Signed)
Labs

## 2013-01-12 NOTE — Assessment & Plan Note (Signed)
Continue with current prescription therapy as reflected on the Med list.  

## 2013-01-12 NOTE — Assessment & Plan Note (Signed)
Worse w/stress - [name] is sick

## 2013-02-26 ENCOUNTER — Other Ambulatory Visit: Payer: Self-pay | Admitting: Internal Medicine

## 2013-03-14 DIAGNOSIS — G5621 Lesion of ulnar nerve, right upper limb: Secondary | ICD-10-CM | POA: Insufficient documentation

## 2013-04-13 ENCOUNTER — Other Ambulatory Visit: Payer: Self-pay | Admitting: *Deleted

## 2013-04-13 ENCOUNTER — Other Ambulatory Visit: Payer: Self-pay | Admitting: Internal Medicine

## 2013-04-13 MED ORDER — IBUPROFEN 800 MG PO TABS: 800.0000 mg | ORAL_TABLET | Freq: Two times a day (BID) | ORAL | Status: DC | PRN

## 2013-07-20 ENCOUNTER — Encounter: Payer: Self-pay | Admitting: Internal Medicine

## 2013-07-20 ENCOUNTER — Ambulatory Visit (INDEPENDENT_AMBULATORY_CARE_PROVIDER_SITE_OTHER): Payer: 59 | Admitting: Internal Medicine

## 2013-07-20 ENCOUNTER — Other Ambulatory Visit (INDEPENDENT_AMBULATORY_CARE_PROVIDER_SITE_OTHER): Payer: 59

## 2013-07-20 VITALS — BP 150/90 | HR 74 | Temp 97.7°F | Ht 66.0 in

## 2013-07-20 DIAGNOSIS — E278 Other specified disorders of adrenal gland: Secondary | ICD-10-CM

## 2013-07-20 DIAGNOSIS — E871 Hypo-osmolality and hyponatremia: Secondary | ICD-10-CM

## 2013-07-20 DIAGNOSIS — R5381 Other malaise: Secondary | ICD-10-CM

## 2013-07-20 DIAGNOSIS — R7309 Other abnormal glucose: Secondary | ICD-10-CM

## 2013-07-20 DIAGNOSIS — R5383 Other fatigue: Secondary | ICD-10-CM

## 2013-07-20 DIAGNOSIS — R739 Hyperglycemia, unspecified: Secondary | ICD-10-CM

## 2013-07-20 DIAGNOSIS — E538 Deficiency of other specified B group vitamins: Secondary | ICD-10-CM

## 2013-07-20 DIAGNOSIS — L570 Actinic keratosis: Secondary | ICD-10-CM

## 2013-07-20 LAB — CBC WITH DIFFERENTIAL/PLATELET
BASOS ABS: 0.1 10*3/uL (ref 0.0–0.1)
Basophils Relative: 0.5 % (ref 0.0–3.0)
Eosinophils Absolute: 0.2 10*3/uL (ref 0.0–0.7)
Eosinophils Relative: 2.5 % (ref 0.0–5.0)
HCT: 40.8 % (ref 36.0–46.0)
HEMOGLOBIN: 13.7 g/dL (ref 12.0–15.0)
LYMPHS ABS: 2.9 10*3/uL (ref 0.7–4.0)
Lymphocytes Relative: 29.9 % (ref 12.0–46.0)
MCHC: 33.6 g/dL (ref 30.0–36.0)
MCV: 90 fl (ref 78.0–100.0)
MONOS PCT: 6.2 % (ref 3.0–12.0)
Monocytes Absolute: 0.6 10*3/uL (ref 0.1–1.0)
NEUTROS ABS: 5.9 10*3/uL (ref 1.4–7.7)
Neutrophils Relative %: 60.9 % (ref 43.0–77.0)
Platelets: 268 10*3/uL (ref 150.0–400.0)
RBC: 4.53 Mil/uL (ref 3.87–5.11)
RDW: 12.5 % (ref 11.5–15.5)
WBC: 9.6 10*3/uL (ref 4.0–10.5)

## 2013-07-20 LAB — BASIC METABOLIC PANEL
BUN: 10 mg/dL (ref 6–23)
CHLORIDE: 102 meq/L (ref 96–112)
CO2: 26 meq/L (ref 19–32)
Calcium: 9.2 mg/dL (ref 8.4–10.5)
Creatinine, Ser: 0.7 mg/dL (ref 0.4–1.2)
GFR: 85.51 mL/min (ref >60.00)
GLUCOSE: 89 mg/dL (ref 70–99)
POTASSIUM: 3.6 meq/L (ref 3.5–5.1)
SODIUM: 136 meq/L (ref 135–145)

## 2013-07-20 LAB — HEPATIC FUNCTION PANEL
ALT: 18 U/L (ref 0–35)
AST: 21 U/L (ref 0–37)
Albumin: 4.3 g/dL (ref 3.5–5.2)
Alkaline Phosphatase: 56 U/L (ref 39–117)
BILIRUBIN TOTAL: 0.7 mg/dL (ref 0.2–1.2)
Bilirubin, Direct: 0.1 mg/dL (ref 0.0–0.3)
Total Protein: 6.8 g/dL (ref 6.0–8.3)

## 2013-07-20 LAB — TSH: TSH: 0.96 u[IU]/mL (ref 0.35–4.50)

## 2013-07-20 LAB — HEMOGLOBIN A1C: Hgb A1c MFr Bld: 5.7 % (ref 4.6–6.5)

## 2013-07-20 MED ORDER — OMEPRAZOLE 40 MG PO CPDR: 40.0000 mg | DELAYED_RELEASE_CAPSULE | Freq: Every day | ORAL | Status: DC

## 2013-07-20 MED ORDER — VITAMIN B-12 1000 MCG SL SUBL: 1.0000 | SUBLINGUAL_TABLET | Freq: Every day | SUBLINGUAL | Status: DC

## 2013-07-20 NOTE — Assessment & Plan Note (Signed)
A1c

## 2013-07-20 NOTE — Assessment & Plan Note (Signed)
Continue with current prescription therapy as reflected on the Med list.  

## 2013-07-20 NOTE — Assessment & Plan Note (Signed)
Re-start B12 

## 2013-07-20 NOTE — Progress Notes (Signed)
Subjective:    HPI   The patient is here to follow up on chronic adrenal depression, anxiety, headaches and chronic moderate OA symptoms controlled with medicines, diet and exercise.  F/u elev glu  Antaniya fell and tore her L triceps tendon -  Dr Truman Hayward at [facility] will repair it on 01/30/13  C/o fatigue and stress...old mother 59 yo, taking care of [name], working a lot...    Wt Readings from Last 3 Encounters:  05/07/09 165 lb (74.844 kg)  06/04/08 163 lb (73.936 kg)  10/25/07 159 lb (72.122 kg)    BP Readings from Last 3 Encounters:  07/20/13 150/90  01/12/13 144/96  09/01/12 130/78     Review of Systems  Constitutional: Negative for chills, activity change, appetite change, fatigue and unexpected weight change.  HENT: Negative for mouth sores and sinus pressure.   Eyes: Negative for visual disturbance.  Respiratory: Negative for cough, choking, chest tightness, shortness of breath, wheezing and stridor.   Gastrointestinal: Negative for nausea.  Genitourinary: Negative for frequency, difficulty urinating and vaginal pain.  Musculoskeletal: Positive for arthralgias, gait problem and neck stiffness. Negative for back pain.  Skin: Negative for pallor and rash.  Neurological: Negative for dizziness, tremors, weakness and numbness.  Psychiatric/Behavioral: Negative for confusion and sleep disturbance.       Objective:   Physical Exam  Constitutional: She appears well-developed. No distress.  Obese  HENT:  Head: Normocephalic.  Right Ear: External ear normal.  Left Ear: External ear normal.  Nose: Nose normal.  Mouth/Throat: Oropharynx is clear and moist.  Eyes: Conjunctivae are normal. Pupils are equal, round, and reactive to light. Right eye exhibits no discharge. Left eye exhibits no discharge.  Neck: Normal range of motion. Neck supple. No JVD present. No tracheal deviation present. No thyromegaly present.  Old scar R neck  Cardiovascular: Normal rate, regular rhythm  and normal heart sounds.   Pulmonary/Chest: No stridor. No respiratory distress. She has no wheezes.  Abdominal: Soft. Bowel sounds are normal. She exhibits no distension and no mass. There is no tenderness. There is no rebound and no guarding.  Musculoskeletal: She exhibits tenderness (B knees). She exhibits no edema.  Lymphadenopathy:    She has no cervical adenopathy.  Neurological: She displays normal reflexes. No cranial nerve deficit. She exhibits normal muscle tone. Coordination normal.  Skin: No rash noted. No erythema.  Psychiatric: She has a normal mood and affect. Her behavior is normal. Judgment and thought content normal.  Scar over L eyebrow from surgery AKx2 on face   Lab Results  Component Value Date   WBC 10.9* 01/12/2013   HGB 13.9 01/12/2013   HCT 41.0 01/12/2013   PLT 291.0 01/12/2013   GLUCOSE 99 01/12/2013   CHOL 197 01/07/2010   TRIG 123.0 01/07/2010   HDL 89.90 01/07/2010   LDLCALC 83 01/07/2010   ALT 25 01/12/2013   AST 24 01/12/2013   NA 136 01/12/2013   K 3.8 01/12/2013   CL 105 01/12/2013   CREATININE 0.7 01/12/2013   BUN 8 01/12/2013   CO2 26 01/12/2013   TSH 1.19 01/12/2013   INR 0.96 07/03/2009   HGBA1C 5.7 01/12/2013     Procedure Note :     Procedure : Cryosurgery   Indication:  Wart(s)  Actinic keratosis(es)   Risks including unsuccessful procedure , bleeding, infection, bruising, scar, a need for a repeat  procedure and others were explained to the patient in detail as well as the benefits. Informed consent  was obtained verbally.   2  lesion(s)  on  face  was/were treated with liquid nitrogen on a Q-tip in a usual fasion . Band-Aid was applied and antibiotic ointment was given for a later use.   Tolerated well. Complications none.   Postprocedure instructions :     Keep the wounds clean. You can wash them with liquid soap and water. Pat dry with gauze or a Kleenex tissue  Before applying antibiotic ointment and a Band-Aid.   You  need to report immediately  if  any signs of infection develop.        Assessment & Plan:

## 2013-07-20 NOTE — Patient Instructions (Signed)
   Postprocedure instructions :     Keep the wounds clean. You can wash them with liquid soap and water. Pat dry with gauze or a Kleenex tissue  Before applying antibiotic ointment and a Band-Aid.   You need to report immediately  if  any signs of infection develop.    

## 2013-07-20 NOTE — Progress Notes (Signed)
Pre visit review using our clinic review tool, if applicable. No additional management support is needed unless otherwise documented below in the visit note. 

## 2013-07-20 NOTE — Assessment & Plan Note (Signed)
Re-start Rx IM inj today

## 2013-07-20 NOTE — Assessment & Plan Note (Signed)
Labs

## 2013-07-22 ENCOUNTER — Encounter: Payer: Self-pay | Admitting: Internal Medicine

## 2013-11-12 ENCOUNTER — Encounter: Payer: Self-pay | Admitting: Internal Medicine

## 2014-01-18 ENCOUNTER — Ambulatory Visit (INDEPENDENT_AMBULATORY_CARE_PROVIDER_SITE_OTHER): Payer: 59 | Admitting: Internal Medicine

## 2014-01-18 ENCOUNTER — Other Ambulatory Visit (INDEPENDENT_AMBULATORY_CARE_PROVIDER_SITE_OTHER): Payer: 59

## 2014-01-18 ENCOUNTER — Encounter: Payer: Self-pay | Admitting: Internal Medicine

## 2014-01-18 VITALS — BP 138/94 | HR 67

## 2014-01-18 DIAGNOSIS — Z23 Encounter for immunization: Secondary | ICD-10-CM

## 2014-01-18 DIAGNOSIS — I9589 Other hypotension: Secondary | ICD-10-CM

## 2014-01-18 DIAGNOSIS — E538 Deficiency of other specified B group vitamins: Secondary | ICD-10-CM

## 2014-01-18 DIAGNOSIS — R739 Hyperglycemia, unspecified: Secondary | ICD-10-CM

## 2014-01-18 DIAGNOSIS — E871 Hypo-osmolality and hyponatremia: Secondary | ICD-10-CM

## 2014-01-18 DIAGNOSIS — E2740 Unspecified adrenocortical insufficiency: Secondary | ICD-10-CM

## 2014-01-18 NOTE — Progress Notes (Signed)
Pre visit review using our clinic review tool, if applicable. No additional management support is needed unless otherwise documented below in the visit note. 

## 2014-01-18 NOTE — Progress Notes (Signed)
   Subjective:    HPI   The patient is here to follow up on chronic adrenal depression, anxiety, headaches and chronic moderate OA symptoms controlled with medicines, diet and exercise.  F/u elev glu  [name] fell and tore her L triceps tendon -  Dr Truman Hayward at [facility] will repair it on 01/30/13  C/o fatigue and stress...old mother 59 yo, taking care of [name], working a lot...    Wt Readings from Last 3 Encounters:  05/07/09 165 lb (74.844 kg)  06/04/08 163 lb (73.936 kg)  10/25/07 159 lb (72.122 kg)    BP Readings from Last 3 Encounters:  01/18/14 138/94  07/20/13 150/90  01/12/13 144/96     Review of Systems  Constitutional: Negative for chills, activity change, appetite change, fatigue and unexpected weight change.  HENT: Negative for mouth sores and sinus pressure.   Eyes: Negative for visual disturbance.  Respiratory: Negative for cough, choking, chest tightness, shortness of breath, wheezing and stridor.   Gastrointestinal: Negative for nausea.  Genitourinary: Negative for frequency, difficulty urinating and vaginal pain.  Musculoskeletal: Positive for arthralgias, gait problem and neck stiffness. Negative for back pain.  Skin: Negative for pallor and rash.  Neurological: Negative for dizziness, tremors, weakness and numbness.  Psychiatric/Behavioral: Negative for confusion and sleep disturbance.       Objective:   Physical Exam  Constitutional: She appears well-developed. No distress.  HENT:  Head: Normocephalic.  Right Ear: External ear normal.  Left Ear: External ear normal.  Nose: Nose normal.  Mouth/Throat: Oropharynx is clear and moist.  Eyes: Conjunctivae are normal. Pupils are equal, round, and reactive to light. Right eye exhibits no discharge. Left eye exhibits no discharge.  Neck: Normal range of motion. Neck supple. No JVD present. No tracheal deviation present. No thyromegaly present.  Cardiovascular: Normal rate, regular rhythm and normal heart sounds.    Pulmonary/Chest: No stridor. No respiratory distress. She has no wheezes.  Abdominal: Soft. Bowel sounds are normal. She exhibits no distension and no mass. There is no tenderness. There is no rebound and no guarding.  Musculoskeletal: She exhibits no edema or tenderness.  Lymphadenopathy:    She has no cervical adenopathy.  Neurological: She displays normal reflexes. No cranial nerve deficit. She exhibits normal muscle tone. Coordination normal.  Skin: No rash noted. No erythema.  Psychiatric: She has a normal mood and affect. Her behavior is normal. Judgment and thought content normal.  Scar over L eyebrow from surgery - much better L leg is wrapped    Lab Results  Component Value Date   WBC 9.6 07/20/2013   HGB 13.7 07/20/2013   HCT 40.8 07/20/2013   PLT 268.0 07/20/2013   GLUCOSE 89 07/20/2013   CHOL 197 01/07/2010   TRIG 123.0 01/07/2010   HDL 89.90 01/07/2010   LDLCALC 83 01/07/2010   ALT 18 07/20/2013   AST 21 07/20/2013   NA 136 07/20/2013   K 3.6 07/20/2013   CL 102 07/20/2013   CREATININE 0.7 07/20/2013   BUN 10 07/20/2013   CO2 26 07/20/2013   TSH 0.96 07/20/2013   INR 0.96 07/03/2009   HGBA1C 5.7 07/20/2013         Assessment & Plan:

## 2014-01-18 NOTE — Assessment & Plan Note (Addendum)
Off Cortef since 6/15 per Verba's choice Stress coverage prn discussed

## 2014-01-18 NOTE — Assessment & Plan Note (Addendum)
Off Cortef since 6/15

## 2014-01-18 NOTE — Assessment & Plan Note (Signed)
Continue with current prescription therapy as reflected on the Med list.  

## 2014-01-18 NOTE — Assessment & Plan Note (Signed)
A1c

## 2014-01-20 LAB — BASIC METABOLIC PANEL
BUN: 6 mg/dL (ref 6–23)
CO2: 27 mEq/L (ref 19–32)
CREATININE: 0.8 mg/dL (ref 0.4–1.2)
Calcium: 8.9 mg/dL (ref 8.4–10.5)
Chloride: 102 mEq/L (ref 96–112)
GFR: 82.78 mL/min (ref >60.00)
GLUCOSE: 95 mg/dL (ref 70–99)
POTASSIUM: 3.6 meq/L (ref 3.5–5.1)
Sodium: 136 mEq/L (ref 135–145)

## 2014-01-20 LAB — HEPATIC FUNCTION PANEL
ALBUMIN: 4.4 g/dL (ref 3.5–5.2)
ALT: 20 U/L (ref 0–35)
AST: 24 U/L (ref 0–37)
Alkaline Phosphatase: 61 U/L (ref 39–117)
Bilirubin, Direct: 0 mg/dL (ref 0.0–0.3)
Total Bilirubin: 0.5 mg/dL (ref 0.2–1.2)
Total Protein: 6.7 g/dL (ref 6.0–8.3)

## 2014-01-20 LAB — CORTISOL: CORTISOL PLASMA: 6.2 ug/dL

## 2014-01-20 LAB — VITAMIN D 25 HYDROXY (VIT D DEFICIENCY, FRACTURES): VITD: 31.53 ng/mL (ref 30.00–100.00)

## 2014-01-20 LAB — TSH: TSH: 1.52 u[IU]/mL (ref 0.35–4.50)

## 2014-01-20 LAB — VITAMIN B12: Vitamin B-12: 286 pg/mL (ref 211–911)

## 2014-07-23 ENCOUNTER — Encounter: Payer: 59 | Admitting: Internal Medicine

## 2014-10-22 ENCOUNTER — Telehealth: Payer: Self-pay | Admitting: Internal Medicine

## 2014-10-22 MED ORDER — PREDNISOLONE 5 MG (21) PO TBPK: 1.0000 | ORAL_TABLET | Freq: Every morning | ORAL | Status: DC

## 2014-10-22 NOTE — Telephone Encounter (Signed)
Poison ivy R arm, R eye - seen w/husband Deltasone dosepack

## 2014-12-13 ENCOUNTER — Other Ambulatory Visit: Payer: Self-pay | Admitting: Internal Medicine

## 2014-12-16 ENCOUNTER — Other Ambulatory Visit: Payer: Self-pay | Admitting: *Deleted

## 2014-12-16 MED ORDER — IBUPROFEN 800 MG PO TABS: 800.0000 mg | ORAL_TABLET | Freq: Two times a day (BID) | ORAL | Status: DC | PRN

## 2014-12-19 ENCOUNTER — Telehealth: Payer: Self-pay | Admitting: *Deleted

## 2014-12-19 NOTE — Telephone Encounter (Signed)
-----   Message from Ian Malkin sent at 12/19/2014  8:15 AM EDT ----- Patient states that she cannot afford care...  ----- Message -----    From: Cresenciano Lick, CMA    Sent: 12/16/2014  11:44 AM      To: Ian Malkin  Please call pt and schedule OV with Plot. Last OV was 01/2014 and he wanted her back in 6 months. Thanks!

## 2015-01-17 ENCOUNTER — Encounter: Payer: Self-pay | Admitting: Cardiovascular Disease

## 2015-03-13 ENCOUNTER — Other Ambulatory Visit: Payer: Self-pay | Admitting: Internal Medicine

## 2015-03-14 NOTE — Telephone Encounter (Signed)
Please advise, thanks.

## 2016-09-07 ENCOUNTER — Other Ambulatory Visit (HOSPITAL_COMMUNITY): Payer: Self-pay | Admitting: Orthopedic Surgery

## 2016-09-07 DIAGNOSIS — M7989 Other specified soft tissue disorders: Principal | ICD-10-CM

## 2016-09-07 DIAGNOSIS — M79605 Pain in left leg: Secondary | ICD-10-CM

## 2016-09-08 ENCOUNTER — Ambulatory Visit (HOSPITAL_COMMUNITY): Admission: RE | Admit: 2016-09-08 | Payer: 59 | Source: Ambulatory Visit

## 2016-12-06 ENCOUNTER — Other Ambulatory Visit: Payer: Self-pay | Admitting: Internal Medicine

## 2016-12-06 DIAGNOSIS — M25562 Pain in left knee: Secondary | ICD-10-CM

## 2016-12-06 DIAGNOSIS — M25561 Pain in right knee: Secondary | ICD-10-CM

## 2016-12-21 ENCOUNTER — Ambulatory Visit
Admission: RE | Admit: 2016-12-21 | Discharge: 2016-12-21 | Disposition: A | Discharge status: Home / Self Care | Payer: 59 | Source: Ambulatory Visit | Attending: Internal Medicine | Admitting: Internal Medicine

## 2016-12-21 ENCOUNTER — Ambulatory Visit
Admission: RE | Admit: 2016-12-21 | Discharge: 2016-12-21 | Disposition: A | Discharge status: Home / Self Care | Payer: No Typology Code available for payment source | Source: Ambulatory Visit | Attending: Internal Medicine | Admitting: Internal Medicine

## 2016-12-21 DIAGNOSIS — M25561 Pain in right knee: Secondary | ICD-10-CM

## 2016-12-21 DIAGNOSIS — M25562 Pain in left knee: Secondary | ICD-10-CM

## 2018-06-20 DIAGNOSIS — K829 Disease of gallbladder, unspecified: Secondary | ICD-10-CM | POA: Insufficient documentation

## 2018-06-22 ENCOUNTER — Other Ambulatory Visit: Payer: Self-pay | Admitting: Obstetrics and Gynecology

## 2018-06-22 DIAGNOSIS — R921 Mammographic calcification found on diagnostic imaging of breast: Secondary | ICD-10-CM

## 2018-08-21 NOTE — H&P (Addendum)
[name] is an 63 y.o. female presenting with anterior and posterior prolapse in addition to requesting removal of ovaries s/s family history of ovarian cancer. She was counseled re: options and strongly desired surgical correction of prolapse in addition to surgical removal of bilateral ovaries and any remaining fallopian tubes. She underwent a hysterectomy in Jun 04, 1992 for pain that resolved after TAH. She thinks her fallopian tubes were removed. Records were unable to be located but patient reported they told her she had adhesions.   Pertinent Gynecological History: Menses: post-menopausal Bleeding: None Contraception: status post hysterectomy DES exposure: denies Blood transfusions: none Sexually transmitted diseases: no past history Previous GYN Procedures: none  Last mammogram: additional views weree needed and patient declined Date: 06-05-18 Last pap: normal Date: 06-05-18 OB History: G3, P3002   Menstrual History: No LMP recorded.    Past Medical History:  Diagnosis Date  . Adrenal insufficiency    post-viral  . Balance problem 2006/06/05   Likely central  . GERD (gastroesophageal reflux disease)   . Gout   . Grief    Son died 2005/06/04  . Hyponatremia Jun 05, 2006  . Hypotension    Poss adrenal insuff. ? Viral encephalitis vm [place] Jun 05, 2006  . Osteoarthritis    LEFT KNEE  . Skin cancer 06-04-09  . Vitamin B 12 deficiency 2007/06/05   Borderline    Past Surgical History:  Procedure Laterality Date  . GIANT CELL CYST REMOVAL  05/30/09   Left 2nd dorsal MCP  . TARSAL TUNNEL RELEASE  Jun 04, 2009   Right Dr Sharol Given  . WRIST SURGERY     Right/ Dr Lucia Bitter 06/04/2008  Total abdominal hysterectomy 06/04/1992 c/b adhesive disease Open gall bladder surgery c/b postoperative voiding issues Abdominal hernia repair as a baby in the 1950s  Family History  Problem Relation Age of Onset  . Diabetes Other        1st degree relative  . Diabetes Mother   . Diabetes Father   . Stroke Father   . Heart disease Father     Social  History:  reports that she has never smoked. She does not have any smokeless tobacco history on file. She reports that she does not drink alcohol or use drugs.  Allergies:  Allergies  Allergen Reactions  . Butrans [Buprenorphine Hcl] Swelling  . Codeine     REACTION: stomach pain  . Doxycycline     REACTION: rash  . Hydrocodone     REACTION: stomach pain    No medications prior to admission.    ROS  There were no vitals taken for this visit. Physical Exam Gen: well appearing, NAD CV: Reg rate Pulm: NWOB Abd: soft, nondistended, nontender, no mases GYN: Vagina: Atrophic epithelium, no lesions or abnormal discharge. Stage 3 anterior prolapse of bladder, vaginal cuff with good support Stage 2 rectal prolapse Ext: No edema b/l  No results found for this or any previous visit (from the past 78 hour(s)).  No results found.  Assessment/Plan: 63 yo w/ant/post prolapse and FH ovarian cancer presenting for scheduled surgical management. Plan is Ant/Post repair with [place] BSO and possible laparotomy. She would like everything done to remove ovaries - including the possibility of an open incision.  Will attempt LUQ entry at [place] given prior umbilical hernia repair in infancy. She has a hx of adhesive disease with prior TAH and GB surgery c/b voiding dysfunction. Therefore, it is possible she may be converted to an open procedure and potentially have a cystoscopy. Risks discussed  including infection, bleeding, damage to surrounding structures, need for additional procedures, postoperative DVT, imability to locate ovaries (pt declines preoperative imaging) and recurrent prolapse. All questions answered. Consent signed.   [name] 08/21/2018, 3:59 PM

## 2018-09-11 ENCOUNTER — Encounter (HOSPITAL_BASED_OUTPATIENT_CLINIC_OR_DEPARTMENT_OTHER): Payer: Self-pay | Admitting: *Deleted

## 2018-09-11 ENCOUNTER — Other Ambulatory Visit (HOSPITAL_COMMUNITY)
Admission: RE | Admit: 2018-09-11 | Discharge: 2018-09-11 | Disposition: A | Discharge status: Home / Self Care | Payer: BC Managed Care – PPO | Source: Ambulatory Visit | Attending: Obstetrics and Gynecology | Admitting: Obstetrics and Gynecology

## 2018-09-11 ENCOUNTER — Other Ambulatory Visit: Payer: Self-pay

## 2018-09-11 DIAGNOSIS — Z20828 Contact with and (suspected) exposure to other viral communicable diseases: Secondary | ICD-10-CM | POA: Insufficient documentation

## 2018-09-11 NOTE — Progress Notes (Signed)
Spoke with patient via telephone for pre op interview. NPO after MN. No medications AM of surgery. 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 [facility] guidelines discussed with patient, patient verbalized understanding. Arrival time 0530.

## 2018-09-12 LAB — SARS CORONAVIRUS 2 (TAT 6-24 HRS): SARS Coronavirus 2: NEGATIVE

## 2018-09-14 ENCOUNTER — Observation Stay (HOSPITAL_BASED_OUTPATIENT_CLINIC_OR_DEPARTMENT_OTHER): Payer: BC Managed Care – PPO | Admitting: Anesthesiology

## 2018-09-14 ENCOUNTER — Encounter (HOSPITAL_BASED_OUTPATIENT_CLINIC_OR_DEPARTMENT_OTHER)
Admission: RE | Disposition: A | Discharge status: Home / Self Care | Payer: Self-pay | Source: Home / Self Care | Attending: Obstetrics and Gynecology

## 2018-09-14 ENCOUNTER — Encounter (HOSPITAL_BASED_OUTPATIENT_CLINIC_OR_DEPARTMENT_OTHER): Payer: Self-pay

## 2018-09-14 ENCOUNTER — Observation Stay (HOSPITAL_BASED_OUTPATIENT_CLINIC_OR_DEPARTMENT_OTHER)
Admission: RE | Admit: 2018-09-14 | Discharge: 2018-09-15 | Disposition: A | Discharge status: Home / Self Care | Payer: BC Managed Care – PPO | Attending: Obstetrics and Gynecology | Admitting: Obstetrics and Gynecology

## 2018-09-14 DIAGNOSIS — N816 Rectocele: Secondary | ICD-10-CM | POA: Diagnosis not present

## 2018-09-14 DIAGNOSIS — Z885 Allergy status to narcotic agent status: Secondary | ICD-10-CM | POA: Insufficient documentation

## 2018-09-14 DIAGNOSIS — E2740 Unspecified adrenocortical insufficiency: Secondary | ICD-10-CM | POA: Diagnosis not present

## 2018-09-14 DIAGNOSIS — N819 Female genital prolapse, unspecified: Secondary | ICD-10-CM | POA: Diagnosis present

## 2018-09-14 DIAGNOSIS — Z9889 Other specified postprocedural states: Secondary | ICD-10-CM

## 2018-09-14 DIAGNOSIS — N8110 Cystocele, unspecified: Secondary | ICD-10-CM | POA: Diagnosis not present

## 2018-09-14 DIAGNOSIS — Z8041 Family history of malignant neoplasm of ovary: Secondary | ICD-10-CM | POA: Diagnosis present

## 2018-09-14 DIAGNOSIS — Z881 Allergy status to other antibiotic agents status: Secondary | ICD-10-CM | POA: Diagnosis not present

## 2018-09-14 HISTORY — PX: LYSIS OF ADHESION: SHX5961

## 2018-09-14 HISTORY — PX: ANTERIOR AND POSTERIOR REPAIR: SHX5121

## 2018-09-14 HISTORY — PX: LAPAROTOMY: SHX154

## 2018-09-14 HISTORY — PX: CYSTOSCOPY: SHX5120

## 2018-09-14 HISTORY — DX: Nausea with vomiting, unspecified: R11.2

## 2018-09-14 HISTORY — DX: Other specified postprocedural states: Z98.890

## 2018-09-14 LAB — ABO/RH: ABO/RH(D): B POS

## 2018-09-14 LAB — TYPE AND SCREEN
ABO/RH(D): B POS
Antibody Screen: NEGATIVE

## 2018-09-14 SURGERY — ANTERIOR (CYSTOCELE) AND POSTERIOR REPAIR (RECTOCELE)
Anesthesia: General | Site: Perineum

## 2018-09-14 MED ORDER — BUPIVACAINE HCL (PF) 0.25 % IJ SOLN
INTRAMUSCULAR | Status: AC
  Filled 2018-09-14: qty 30

## 2018-09-14 MED ORDER — ESTRADIOL 0.1 MG/GM VA CREA
TOPICAL_CREAM | VAGINAL | Status: DC | PRN
  Administered 2018-09-14: 1 via VAGINAL

## 2018-09-14 MED ORDER — ACETAMINOPHEN 325 MG PO TABS
325.0000 mg | ORAL_TABLET | ORAL | Status: DC | PRN
  Filled 2018-09-14: qty 2

## 2018-09-14 MED ORDER — ACETAMINOPHEN 500 MG PO TABS
ORAL_TABLET | ORAL | Status: AC
  Filled 2018-09-14: qty 2

## 2018-09-14 MED ORDER — ESTRADIOL 0.1 MG/GM VA CREA
TOPICAL_CREAM | VAGINAL | Status: AC
  Filled 2018-09-14: qty 42.5

## 2018-09-14 MED ORDER — SUGAMMADEX SODIUM 200 MG/2ML IV SOLN
INTRAVENOUS | Status: DC | PRN
  Administered 2018-09-14: 200 mg via INTRAVENOUS

## 2018-09-14 MED ORDER — KETOROLAC TROMETHAMINE 30 MG/ML IJ SOLN
INTRAMUSCULAR | Status: AC
  Filled 2018-09-14: qty 1

## 2018-09-14 MED ORDER — PROMETHAZINE HCL 25 MG/ML IJ SOLN
6.2500 mg | INTRAMUSCULAR | Status: DC | PRN
  Administered 2018-09-14: 6.25 mg via INTRAVENOUS
  Filled 2018-09-14: qty 1

## 2018-09-14 MED ORDER — PANTOPRAZOLE SODIUM 40 MG PO TBEC
40.0000 mg | DELAYED_RELEASE_TABLET | Freq: Every day | ORAL | Status: DC
  Administered 2018-09-14: 40 mg via ORAL
  Filled 2018-09-14: qty 1

## 2018-09-14 MED ORDER — ACETAMINOPHEN 10 MG/ML IV SOLN
INTRAVENOUS | Status: AC
  Filled 2018-09-14: qty 100

## 2018-09-14 MED ORDER — EPHEDRINE SULFATE-NACL 50-0.9 MG/10ML-% IV SOSY
PREFILLED_SYRINGE | INTRAVENOUS | Status: DC | PRN
  Administered 2018-09-14 (×3): 10 mg via INTRAVENOUS

## 2018-09-14 MED ORDER — CEFAZOLIN SODIUM-DEXTROSE 2-4 GM/100ML-% IV SOLN
2.0000 g | INTRAVENOUS | Status: AC
  Administered 2018-09-14: 2 g via INTRAVENOUS
  Filled 2018-09-14: qty 100

## 2018-09-14 MED ORDER — ONDANSETRON HCL 4 MG/2ML IJ SOLN
INTRAMUSCULAR | Status: AC
  Filled 2018-09-14: qty 2

## 2018-09-14 MED ORDER — SCOPOLAMINE 1 MG/3DAYS TD PT72
MEDICATED_PATCH | TRANSDERMAL | Status: DC | PRN
  Administered 2018-09-14: 1 via TRANSDERMAL

## 2018-09-14 MED ORDER — LIDOCAINE 2% (20 MG/ML) 5 ML SYRINGE
INTRAMUSCULAR | Status: DC | PRN
  Administered 2018-09-14: 60 mg via INTRAVENOUS

## 2018-09-14 MED ORDER — EPHEDRINE 5 MG/ML INJ
INTRAVENOUS | Status: AC
  Filled 2018-09-14: qty 10

## 2018-09-14 MED ORDER — LACTATED RINGERS IV SOLN
INTRAVENOUS | Status: DC
  Administered 2018-09-14 (×3): via INTRAVENOUS
  Filled 2018-09-14 (×2): qty 1000

## 2018-09-14 MED ORDER — OXYCODONE HCL 5 MG PO TABS
5.0000 mg | ORAL_TABLET | ORAL | Status: DC | PRN
  Administered 2018-09-14: 5 mg via ORAL
  Administered 2018-09-14: 10 mg via ORAL
  Administered 2018-09-15 (×2): 5 mg via ORAL
  Administered 2018-09-15: 10 mg via ORAL
  Filled 2018-09-14: qty 2

## 2018-09-14 MED ORDER — MEPERIDINE HCL 25 MG/ML IJ SOLN
6.2500 mg | INTRAMUSCULAR | Status: DC | PRN
  Filled 2018-09-14: qty 1

## 2018-09-14 MED ORDER — KETOROLAC TROMETHAMINE 30 MG/ML IJ SOLN
30.0000 mg | Freq: Four times a day (QID) | INTRAMUSCULAR | Status: AC
  Administered 2018-09-14 – 2018-09-15 (×4): 30 mg via INTRAVENOUS
  Filled 2018-09-14: qty 1

## 2018-09-14 MED ORDER — ACETAMINOPHEN 160 MG/5ML PO SOLN
325.0000 mg | ORAL | Status: DC | PRN
  Filled 2018-09-14: qty 20.3

## 2018-09-14 MED ORDER — FENTANYL CITRATE (PF) 100 MCG/2ML IJ SOLN
25.0000 ug | INTRAMUSCULAR | Status: DC | PRN
  Administered 2018-09-14 (×3): 25 ug via INTRAVENOUS
  Filled 2018-09-14: qty 1

## 2018-09-14 MED ORDER — PROPOFOL 10 MG/ML IV BOLUS
INTRAVENOUS | Status: DC | PRN
  Administered 2018-09-14: 180 mg via INTRAVENOUS

## 2018-09-14 MED ORDER — SENNOSIDES-DOCUSATE SODIUM 8.6-50 MG PO TABS
1.0000 | ORAL_TABLET | Freq: Every evening | ORAL | Status: DC | PRN
  Filled 2018-09-14: qty 1

## 2018-09-14 MED ORDER — BUPIVACAINE HCL (PF) 0.5 % IJ SOLN
INTRAMUSCULAR | Status: AC
  Filled 2018-09-14: qty 30

## 2018-09-14 MED ORDER — ONDANSETRON HCL 4 MG/2ML IJ SOLN
4.0000 mg | Freq: Four times a day (QID) | INTRAMUSCULAR | Status: DC | PRN
  Administered 2018-09-14: 4 mg via INTRAVENOUS
  Filled 2018-09-14: qty 2

## 2018-09-14 MED ORDER — PROPOFOL 10 MG/ML IV BOLUS
INTRAVENOUS | Status: AC
  Filled 2018-09-14: qty 40

## 2018-09-14 MED ORDER — DOCUSATE SODIUM 100 MG PO CAPS
100.0000 mg | ORAL_CAPSULE | Freq: Two times a day (BID) | ORAL | Status: DC
  Administered 2018-09-14: 100 mg via ORAL
  Filled 2018-09-14: qty 1

## 2018-09-14 MED ORDER — CEFAZOLIN SODIUM-DEXTROSE 2-4 GM/100ML-% IV SOLN
INTRAVENOUS | Status: AC
  Filled 2018-09-14: qty 100

## 2018-09-14 MED ORDER — ACETAMINOPHEN 10 MG/ML IV SOLN
INTRAVENOUS | Status: DC | PRN
  Administered 2018-09-14: 1000 mg via INTRAVENOUS

## 2018-09-14 MED ORDER — VASOPRESSIN 20 UNIT/ML IV SOLN
INTRAVENOUS | Status: AC
  Filled 2018-09-14: qty 1

## 2018-09-14 MED ORDER — PHENYLEPHRINE 40 MCG/ML (10ML) SYRINGE FOR IV PUSH (FOR BLOOD PRESSURE SUPPORT)
PREFILLED_SYRINGE | INTRAVENOUS | Status: DC | PRN
  Administered 2018-09-14 (×5): 80 ug via INTRAVENOUS

## 2018-09-14 MED ORDER — OXYCODONE HCL 5 MG PO TABS
ORAL_TABLET | ORAL | Status: AC
  Filled 2018-09-14: qty 1

## 2018-09-14 MED ORDER — SODIUM CHLORIDE (PF) 0.9 % IJ SOLN
INTRAMUSCULAR | Status: AC
  Filled 2018-09-14: qty 100

## 2018-09-14 MED ORDER — FENTANYL CITRATE (PF) 250 MCG/5ML IJ SOLN
INTRAMUSCULAR | Status: AC
  Filled 2018-09-14: qty 5

## 2018-09-14 MED ORDER — ONDANSETRON HCL 4 MG PO TABS
4.0000 mg | ORAL_TABLET | Freq: Four times a day (QID) | ORAL | Status: DC | PRN
  Filled 2018-09-14: qty 1

## 2018-09-14 MED ORDER — PROMETHAZINE HCL 25 MG/ML IJ SOLN
INTRAMUSCULAR | Status: AC
  Filled 2018-09-14: qty 1

## 2018-09-14 MED ORDER — KETOROLAC TROMETHAMINE 30 MG/ML IJ SOLN
30.0000 mg | Freq: Once | INTRAMUSCULAR | Status: AC | PRN
  Administered 2018-09-14: 30 mg via INTRAVENOUS
  Filled 2018-09-14: qty 1

## 2018-09-14 MED ORDER — PANTOPRAZOLE SODIUM 40 MG PO TBEC
DELAYED_RELEASE_TABLET | ORAL | Status: AC
  Filled 2018-09-14: qty 1

## 2018-09-14 MED ORDER — ALUM & MAG HYDROXIDE-SIMETH 200-200-20 MG/5ML PO SUSP
30.0000 mL | ORAL | Status: DC | PRN
  Filled 2018-09-14: qty 30

## 2018-09-14 MED ORDER — PHENYLEPHRINE 40 MCG/ML (10ML) SYRINGE FOR IV PUSH (FOR BLOOD PRESSURE SUPPORT)
PREFILLED_SYRINGE | INTRAVENOUS | Status: AC
  Filled 2018-09-14: qty 10

## 2018-09-14 MED ORDER — VASOPRESSIN 20 UNIT/ML IV SOLN
INTRAVENOUS | Status: DC | PRN
  Administered 2018-09-14: 09:00:00 14 mL via INTRAMUSCULAR

## 2018-09-14 MED ORDER — FENTANYL CITRATE (PF) 100 MCG/2ML IJ SOLN
INTRAMUSCULAR | Status: AC
  Filled 2018-09-14: qty 2

## 2018-09-14 MED ORDER — SODIUM CHLORIDE 0.9 % IR SOLN
Status: DC | PRN
  Administered 2018-09-14: 1000 mL

## 2018-09-14 MED ORDER — ACETAMINOPHEN 500 MG PO TABS
1000.0000 mg | ORAL_TABLET | Freq: Four times a day (QID) | ORAL | Status: DC
  Administered 2018-09-14 – 2018-09-15 (×3): 1000 mg via ORAL
  Filled 2018-09-14: qty 2

## 2018-09-14 MED ORDER — IBUPROFEN 800 MG PO TABS
800.0000 mg | ORAL_TABLET | Freq: Four times a day (QID) | ORAL | Status: DC
  Filled 2018-09-14: qty 1

## 2018-09-14 MED ORDER — MIDAZOLAM HCL 2 MG/2ML IJ SOLN
INTRAMUSCULAR | Status: DC | PRN
  Administered 2018-09-14: 2 mg via INTRAVENOUS

## 2018-09-14 MED ORDER — ONDANSETRON HCL 4 MG/2ML IJ SOLN
INTRAMUSCULAR | Status: DC | PRN
  Administered 2018-09-14: 4 mg via INTRAVENOUS

## 2018-09-14 MED ORDER — DEXTROSE-NACL 5-0.45 % IV SOLN
125.0000 mL/h | INTRAVENOUS | Status: DC
  Administered 2018-09-14 – 2018-09-15 (×2): 125 mL/h via INTRAVENOUS
  Filled 2018-09-14 (×3): qty 1000

## 2018-09-14 MED ORDER — FENTANYL CITRATE (PF) 100 MCG/2ML IJ SOLN
INTRAMUSCULAR | Status: DC | PRN
  Administered 2018-09-14 (×3): 50 ug via INTRAVENOUS

## 2018-09-14 MED ORDER — MIDAZOLAM HCL 2 MG/2ML IJ SOLN
INTRAMUSCULAR | Status: AC
  Filled 2018-09-14: qty 2

## 2018-09-14 MED ORDER — DEXAMETHASONE SODIUM PHOSPHATE 10 MG/ML IJ SOLN
INTRAMUSCULAR | Status: DC | PRN
  Administered 2018-09-14: 10 mg via INTRAVENOUS

## 2018-09-14 MED ORDER — DOCUSATE SODIUM 100 MG PO CAPS
ORAL_CAPSULE | ORAL | Status: AC
  Filled 2018-09-14: qty 1

## 2018-09-14 MED ORDER — ROCURONIUM BROMIDE 10 MG/ML (PF) SYRINGE
PREFILLED_SYRINGE | INTRAVENOUS | Status: DC | PRN
  Administered 2018-09-14: 50 mg via INTRAVENOUS
  Administered 2018-09-14: 5 mg via INTRAVENOUS

## 2018-09-14 MED ORDER — SIMETHICONE 80 MG PO CHEW
80.0000 mg | CHEWABLE_TABLET | Freq: Four times a day (QID) | ORAL | Status: DC | PRN
  Administered 2018-09-15: 80 mg via ORAL
  Filled 2018-09-14: qty 1

## 2018-09-14 MED ORDER — OXYCODONE HCL 5 MG PO TABS
ORAL_TABLET | ORAL | Status: AC
  Filled 2018-09-14: qty 2

## 2018-09-14 SURGICAL SUPPLY — 66 items
ADH SKN CLS APL DERMABOND .7 (GAUZE/BANDAGES/DRESSINGS) ×5
APL SKNCLS STERI-STRIP NONHPOA (GAUZE/BANDAGES/DRESSINGS) ×5
BAG RETRIEVAL 10 (BASKET)
BENZOIN TINCTURE PRP APPL 2/3 (GAUZE/BANDAGES/DRESSINGS) ×1 IMPLANT
CABLE HIGH FREQUENCY MONO STRZ (ELECTRODE) IMPLANT
CATH ROBINSON RED A/P 16FR (CATHETERS) ×5 IMPLANT
COVER MAYO STAND STRL (DRAPES) ×6 IMPLANT
COVER WAND RF STERILE (DRAPES) ×6 IMPLANT
DECANTER SPIKE VIAL GLASS SM (MISCELLANEOUS) IMPLANT
DERMABOND ADVANCED (GAUZE/BANDAGES/DRESSINGS) ×1
DERMABOND ADVANCED .7 DNX12 (GAUZE/BANDAGES/DRESSINGS) ×5 IMPLANT
DRSG OPSITE POSTOP 3X4 (GAUZE/BANDAGES/DRESSINGS) IMPLANT
DRSG OPSITE POSTOP 4X10 (GAUZE/BANDAGES/DRESSINGS) ×1 IMPLANT
DRSG PAD ABDOMINAL 8X10 ST (GAUZE/BANDAGES/DRESSINGS) ×1 IMPLANT
DURAPREP 26ML APPLICATOR (WOUND CARE) ×6 IMPLANT
GAUZE 4X4 16PLY RFD (DISPOSABLE) ×6 IMPLANT
GAUZE PACKING 1 X5 YD ST (GAUZE/BANDAGES/DRESSINGS) ×6 IMPLANT
GAUZE SPONGE 4X4 12PLY STRL LF (GAUZE/BANDAGES/DRESSINGS) ×1 IMPLANT
GLOVE BIO SURGEON STRL SZ 6.5 (GLOVE) ×6 IMPLANT
GLOVE BIOGEL PI IND STRL 6.5 (GLOVE) ×5 IMPLANT
GLOVE BIOGEL PI INDICATOR 6.5 (GLOVE) ×1
GOWN STRL REUS W/TWL LRG LVL3 (GOWN DISPOSABLE) ×14 IMPLANT
HEMOSTAT ARISTA ABSORB 3G PWDR (HEMOSTASIS) ×1 IMPLANT
HOLDER FOLEY CATH W/STRAP (MISCELLANEOUS) ×1 IMPLANT
IV NS 1000ML (IV SOLUTION)
IV NS 1000ML BAXH (IV SOLUTION) IMPLANT
LIGASURE LAP L-HOOKWIRE 5 44CM (INSTRUMENTS) IMPLANT
NDL HYPO 25X1 1.5 SAFETY (NEEDLE) ×5 IMPLANT
NEEDLE HYPO 22GX1.5 SAFETY (NEEDLE) ×6 IMPLANT
NEEDLE HYPO 25X1 1.5 SAFETY (NEEDLE) ×6 IMPLANT
NEEDLE INSUFFLATION 120MM (ENDOMECHANICALS) ×6 IMPLANT
NS IRRIG 500ML POUR BTL (IV SOLUTION) ×6 IMPLANT
PACK LAPAROSCOPY BASIN (CUSTOM PROCEDURE TRAY) ×6 IMPLANT
PACK VAGINAL WOMENS (CUSTOM PROCEDURE TRAY) ×6 IMPLANT
PENCIL BUTTON HOLSTER BLD 10FT (ELECTRODE) ×1 IMPLANT
SCISSORS LAP 5X35 DISP (ENDOMECHANICALS) IMPLANT
SCISSORS LAP 5X45 EPIX DISP (ENDOMECHANICALS) IMPLANT
SET IRRIG TUBING LAPAROSCOPIC (IRRIGATION / IRRIGATOR) IMPLANT
SET IRRIG Y TYPE TUR BLADDER L (SET/KITS/TRAYS/PACK) ×1 IMPLANT
SPONGE LAP 18X18 RF (DISPOSABLE) ×2 IMPLANT
STRIP CLOSURE SKIN 1/2X4 (GAUZE/BANDAGES/DRESSINGS) ×1 IMPLANT
SUT PLAIN 2 0 XLH (SUTURE) ×1 IMPLANT
SUT VIC AB 0 CT1 27 (SUTURE) ×24
SUT VIC AB 0 CT1 27XBRD ANBCTR (SUTURE) IMPLANT
SUT VIC AB 0 CT1 27XCR 8 STRN (SUTURE) IMPLANT
SUT VIC AB 2-0 UR6 27 (SUTURE) ×6 IMPLANT
SUT VIC AB 3-0 CT1 27 (SUTURE) ×6
SUT VIC AB 3-0 CT1 36 (SUTURE) ×7 IMPLANT
SUT VIC AB 3-0 CT1 TAPERPNT 27 (SUTURE) IMPLANT
SUT VIC AB 3-0 SH 27 (SUTURE) ×18
SUT VIC AB 3-0 SH 27X BRD (SUTURE) IMPLANT
SUT VICRYL 0 TIES 12 18 (SUTURE) ×1 IMPLANT
SUT VICRYL 0 UR6 27IN ABS (SUTURE) ×6 IMPLANT
SUT VICRYL RAPIDE 4/0 PS 2 (SUTURE) ×12 IMPLANT
SYR 50ML LL SCALE MARK (SYRINGE) IMPLANT
SYS BAG RETRIEVAL 10MM (BASKET)
SYSTEM BAG RETRIEVAL 10MM (BASKET) IMPLANT
TOWEL OR 17X26 10 PK STRL BLUE (TOWEL DISPOSABLE) ×11 IMPLANT
TRAY FOLEY W/BAG SLVR 14FR (SET/KITS/TRAYS/PACK) ×6 IMPLANT
TROCAR XCEL NON-BLD 11X100MML (ENDOMECHANICALS) IMPLANT
TROCAR XCEL NON-BLD 5MMX100MML (ENDOMECHANICALS) ×6 IMPLANT
TUBE CONNECTING 12X1/4 (SUCTIONS) ×2 IMPLANT
TUBING EVAC SMOKE HEATED PNEUM (TUBING) ×6 IMPLANT
WARMER LAPAROSCOPE (MISCELLANEOUS) ×6 IMPLANT
WATER STERILE IRR 500ML POUR (IV SOLUTION) ×6 IMPLANT
YANKAUER SUCT BULB TIP NO VENT (SUCTIONS) ×1 IMPLANT

## 2018-09-14 NOTE — Progress Notes (Signed)
Day of Surgery Procedure(s) (LRB): ANTERIOR (CYSTOCELE) AND POSTERIOR REPAIR (RECTOCELE) (N/A) OPEN LAPAROTOMY, LEFT SALPINGO-OOPHORECTOMY, RIGHT OOPHORECTOMY (Bilateral) CYSTOSCOPY (N/A) LYSIS OF ADHESIONS (N/A)  Subjective: Patient reports tolerating PO.    Objective: I have reviewed patient's vital signs, intake and output and medications.  General: alert, cooperative and appears stated age Resp: normal percussion bilaterally Cardio: regular rate and rhythm, S1, S2 normal, no murmur, click, rub or gallop GI: soft, non-tender; bowel sounds normal; no masses,  no organomegaly and incision: clean, dry and intact Vaginal Bleeding: none packing in place  Assessment: s/p Procedure(s): ANTERIOR (CYSTOCELE) AND POSTERIOR REPAIR (RECTOCELE) (N/A) OPEN LAPAROTOMY, LEFT SALPINGO-OOPHORECTOMY, RIGHT OOPHORECTOMY (Bilateral) CYSTOSCOPY (N/A) LYSIS OF ADHESIONS (N/A): stable, progressing well and tolerating diet  Plan: Advance diet Encourage ambulation remove vaginal packing in am and do voiding trial  LOS: 1 day    Tyson Dense 09/14/2018, 5:29 PM

## 2018-09-14 NOTE — Anesthesia Preprocedure Evaluation (Signed)
Anesthesia Evaluation  Patient identified by MRN, date of birth, ID band Patient awake    Reviewed: Allergy & Precautions, NPO status , Patient's Chart, lab work & pertinent test results  History of Anesthesia Complications (+) PONV  Airway Mallampati: I       Dental no notable dental hx. (+) Teeth Intact   Pulmonary neg pulmonary ROS,    Pulmonary exam normal breath sounds clear to auscultation       Cardiovascular negative cardio ROS Normal cardiovascular exam Rhythm:Regular Rate:Normal     Neuro/Psych negative neurological ROS  negative psych ROS   GI/Hepatic Neg liver ROS,   Endo/Other  negative endocrine ROS  Renal/GU negative Renal ROS  negative genitourinary   Musculoskeletal   Abdominal Normal abdominal exam  (+)   Peds negative pediatric ROS (+)  Hematology negative hematology ROS (+)   Anesthesia Other Findings   Reproductive/Obstetrics negative OB ROS                             Anesthesia Physical Anesthesia Plan  ASA: II  Anesthesia Plan: General   Post-op Pain Management:    Induction: Intravenous  PONV Risk Score and Plan: 4 or greater and Ondansetron, Dexamethasone, Midazolam and Promethazine  Airway Management Planned: Oral ETT  Additional Equipment: None  Intra-op Plan:   Post-operative Plan: Extubation in OR  Informed Consent: I have reviewed the patients History and Physical, chart, labs and discussed the procedure including the risks, benefits and alternatives for the proposed anesthesia with the patient or authorized representative who has indicated his/her understanding and acceptance.     Dental advisory given  Plan Discussed with: CRNA  Anesthesia Plan Comments:         Anesthesia Quick Evaluation

## 2018-09-14 NOTE — Brief Op Note (Signed)
09/14/2018  5:21 PM  PATIENT:  [name]  63 y.o. female  PRE-OPERATIVE DIAGNOSIS:  prolapse  POST-OPERATIVE DIAGNOSIS:  prolapse  PROCEDURE:  Procedure(s): ANTERIOR (CYSTOCELE) AND POSTERIOR REPAIR (RECTOCELE) (N/A) OPEN LAPAROTOMY, LEFT SALPINGO-OOPHORECTOMY, RIGHT OOPHORECTOMY (Bilateral) CYSTOSCOPY (N/A) LYSIS OF ADHESIONS (N/A)  SURGEON:  Surgeon(s) and Role:    * Onnie Alatorre, Colin Benton, MD - Primary    * Everlene Farrier, MD - Assisting  PHYSICIAN ASSISTANT:   ASSISTANTS: Dr. Joycelyn Schmid   ANESTHESIA:   general  EBL:  50 mL   BLOOD ADMINISTERED:none  DRAINS: Urinary Catheter (Foley)   LOCAL MEDICATIONS USED:  LIDOCAINE  and Amount: 20 ml  SPECIMEN:  Source of Specimen:  bilateral ovaries, left fallopian tube  DISPOSITION OF SPECIMEN:  PATHOLOGY  COUNTS:  YES  TOURNIQUET:  * No tourniquets in log *  DICTATION: .Note written in EPIC  PLAN OF CARE: Admit for overnight observation  PATIENT DISPOSITION:  PACU - hemodynamically stable.   Delay start of Pharmacological VTE agent (>24hrs) due to surgical blood loss or risk of bleeding: not applicable

## 2018-09-14 NOTE — Progress Notes (Signed)
D; BP low, notified MD Julien Girt, observe pt, no new orders.  BP 82/52, recycled BP was 85/53 HR 66 O2sat 98 with roomair, no symptomatic of dizziness.  Will monitor closely.

## 2018-09-14 NOTE — Transfer of Care (Signed)
  Last Vitals:  Vitals Value Taken Time  BP 110/64 09/14/18 0946  Temp    Pulse 76 09/14/18 0953  Resp 17 09/14/18 0953  SpO2 100 % 09/14/18 0953  Vitals shown include unvalidated device data.  Last Pain:  Vitals:   09/14/18 0543  TempSrc: Oral  PainSc: 0-No pain      Patients Stated Pain Goal: 4 (09/14/18 0543)  Immediate Anesthesia Transfer of Care Note  Patient: [name]  Procedure(s) Performed: Procedure(s) (LRB): ANTERIOR (CYSTOCELE) AND POSTERIOR REPAIR (RECTOCELE) (N/A) OPEN LAPAROTOMY, LEFT SALPINGO-OOPHORECTOMY, RIGHT OOPHORECTOMY (Bilateral) CYSTOSCOPY (N/A) LYSIS OF ADHESIONS (N/A)  Patient Location: PACU  Anesthesia Type: General  Level of Consciousness: awake, alert  and oriented  Airway & Oxygen Therapy: Patient Spontanous Breathing and Patient connected to nasal cannula oxygen  Post-op Assessment: Report given to PACU RN and Post -op Vital signs reviewed and stable  Post vital signs: Reviewed and stable  Complications: No apparent anesthesia complications

## 2018-09-14 NOTE — Op Note (Signed)
PREOPERATIVE DIAGNOSES: 1. Family history of ovarian cancer 2. Pelvic organ prolapse  POSTOPERATIVE DIAGNOSIS: 1. same  OPERATION PERFORMED: Mini Laparotomy, bilateral oophorectomy, leftsalpingectomy, Anterior Colporrhaphy, Posterior Colporrhaphy, and cystoscopy  SURGEON:  Lucillie Garfinkel, MD  ANESTHESIA:  General.  OPERATIVE FINDINGS: 1. Exam under anesthesia revealed immobile skin with likely scarring at planned point of [facility] entry when pt was relaxed. Decision made to proceed with mini lap.  2. Adhesive disease in pelvis  SPECIMEN SENT: Bilateral ovaries, left fallopian tube  ESTIMATED BLOOD LOSS: 50 cc  URINE OUTPUT: See anesthesia record  PROCEDURE:  The patient was taken to the OR where she was placed under general anesthesia without difficulty. She was then prepped and draped in the usual sterile fashion and placed in the dorsal lithotomy position. A preoperative bimanual examination revealed findings as above. A 5cm mini lap incision was made and carried down to the underlying fascia. Fascia was incised in the midline and extended bilaterally with mayo scissors. Underlying rectus muscles were separated from the fascia superiorly and inferiorly in the usual fashion. Peritoneum was entered sharply with hemostat and metz with good visualization of the bladder and bowel. Once inside the abdominal cavity, Above intraoperative findings noted.  The right ovary was noted and isolated from surrounding adhesions. The IP was isolated and clamped. The ovary was easily removed and the IP sutured.  The same was done on the left except with more dense adhesive disease. Care was taken to avoid injury to bowel and bladder.  The left fallopian tube was noted, clamped, and ligated.  Arista placed. Good hemostasis noted.  The fascia was closed with #0 Vicryl in a running continuous manner and the subcutaneous tissue was also closed with #2-0 Vicryl in a running continuous manner. Hemostasis was  secured throughout the entire layers. The incision was then closed with 4'0 vicryl.   Attention was then turned to below.   A dilute epi/lidocaine solution was infiltrated under the anterior vaginal mucosa midline. A small incision was made in the vaginal mucosa at the vaginal vault and the Metzenbaum scissors were then used to dissect the mucosa off of the cystocele and cut the vaginal mucosa in the midline. The cut edges were held and splayed laterally with a series of Allis clamps. The bladder was dissected away along the lateral edges with a combination of sharp and blunt dissection, exposing the vesicovaginal space. A series of #2-0 Vicryl interrupted sutures were then placed sequentially along the lateral folds of the vesicovaginal space and brought together to tuck the bladder back while simultaneously bringing the lateral vaginal tissues together. The excess vaginal mucosa was then trimmed. The vagina was then closed with a running locked #0 Vicryl suture.   Alice clamps were used to tent up the rectocele. A dilute epi/lidocaine solution was infiltrated under the posterior vaginal mucosa midline. A transverse incision was cut. The posterior vaginal wall was opened vertically and midline up to the apex of the rectocele. The cut edges were held and splayed laterally with a series of Allis clamps. The open vaginal mucosa was then dissected laterally with a combination of sharp and blunt dissection, exposing the perirectal fascia. The perirectal fascia was then reapproximated with interrupted #2-0 Vicryl sutures to draw the lateral folds together and tuck the rectocele back. Deep interrupted sutures of #0 Vicryl were used to reapproximate the fibers of the levator ani muscles. The excess vaginal mucosa was trimmed. The posterior vaginal wall was closed with a running vicryl to the  hymenal tags. Rectal exams were repeated throughout the repair and at the end to ensure there were no sutures penetrating  rectal tissue.  A cystoscope was inserted into the bladder. The bladder was intact and normal looking, with no evidence of trauma or perforation. Bilateral ureteric jets were visualized. The bladder was drained through the sheath of the cystoscope, and then the cystoscope was removed. The foley catheter was then reinserted.  Vaginal packing with a small amount of estrace cream was placed in the vagina.  Packing and foley will be removed on POD#1   The patient tolerated the operation nicely and was then taken to the Recovery Room in good condition.    Arty Baumgartner MD

## 2018-09-14 NOTE — Anesthesia Postprocedure Evaluation (Signed)
Anesthesia Post Note  Patient: [name]  Procedure(s) Performed: ANTERIOR (CYSTOCELE) AND POSTERIOR REPAIR (RECTOCELE) (N/A Perineum) OPEN LAPAROTOMY, LEFT SALPINGO-OOPHORECTOMY, RIGHT OOPHORECTOMY (Bilateral Abdomen) CYSTOSCOPY (N/A Bladder) LYSIS OF ADHESIONS (N/A Abdomen)     Patient location during evaluation: Specials Recovery Anesthesia Type: General Level of consciousness: awake Pain management: pain level controlled Vital Signs Assessment: post-procedure vital signs reviewed and stable Respiratory status: spontaneous breathing Cardiovascular status: stable Postop Assessment: no apparent nausea or vomiting Anesthetic complications: no    Last Vitals:  Vitals:   09/14/18 1145 09/14/18 1214  BP: (!) 85/52 (!) 94/57  Pulse: 75   Resp: 15 16  Temp:  36.6 C  SpO2: 100% 100%    Last Pain:  Vitals:   09/14/18 1214  TempSrc:   PainSc: 6    Pain Goal: Patients Stated Pain Goal: 6 (09/14/18 1214)                 Huston Foley

## 2018-09-14 NOTE — Anesthesia Procedure Notes (Signed)
Procedure Name: Intubation Date/Time: 09/14/2018 7:38 AM Performed by: Lyn Hollingshead, MD Pre-anesthesia Checklist: Patient identified, Emergency Drugs available, Suction available and Patient being monitored Patient Re-evaluated:Patient Re-evaluated prior to induction Oxygen Delivery Method: Circle system utilized Preoxygenation: Pre-oxygenation with 100% oxygen Induction Type: IV induction Ventilation: Mask ventilation without difficulty Laryngoscope Size: Mac and 3 Grade View: Grade I Tube type: Oral Tube size: 7.0 mm Number of attempts: 1 Airway Equipment and Method: Stylet and Oral airway Placement Confirmation: ETT inserted through vocal cords under direct vision,  positive ETCO2 and breath sounds checked- equal and bilateral Secured at: 21 cm Tube secured with: Tape Dental Injury: Teeth and Oropharynx as per pre-operative assessment

## 2018-09-14 NOTE — Progress Notes (Signed)
No updates to above H&P. Patient arrived NPO and was consented in PACU. Risks again discussed, all questions answered, and consent signed. Proceed with above surgery.    Lexxie Winberg MD  

## 2018-09-15 ENCOUNTER — Encounter (HOSPITAL_BASED_OUTPATIENT_CLINIC_OR_DEPARTMENT_OTHER): Payer: Self-pay | Admitting: Obstetrics and Gynecology

## 2018-09-15 DIAGNOSIS — N8110 Cystocele, unspecified: Secondary | ICD-10-CM | POA: Diagnosis not present

## 2018-09-15 LAB — CBC
HCT: 35.2 % — ABNORMAL LOW (ref 36.0–46.0)
Hemoglobin: 11.7 g/dL — ABNORMAL LOW (ref 12.0–15.0)
MCH: 30.5 pg (ref 26.0–34.0)
MCHC: 33.2 g/dL (ref 30.0–36.0)
MCV: 91.9 fL (ref 80.0–100.0)
Platelets: 239 10*3/uL (ref 150–400)
RBC: 3.83 MIL/uL — ABNORMAL LOW (ref 3.87–5.11)
RDW: 12.5 % (ref 11.5–15.5)
WBC: 15.3 10*3/uL — ABNORMAL HIGH (ref 4.0–10.5)
nRBC: 0 % (ref 0.0–0.2)

## 2018-09-15 MED ORDER — KETOROLAC TROMETHAMINE 30 MG/ML IJ SOLN
INTRAMUSCULAR | Status: AC
  Filled 2018-09-15: qty 1

## 2018-09-15 MED ORDER — OXYCODONE HCL 5 MG PO TABS
ORAL_TABLET | ORAL | Status: AC
  Filled 2018-09-15: qty 1

## 2018-09-15 MED ORDER — OXYCODONE HCL 5 MG PO TABS: 5.0000 mg | ORAL_TABLET | ORAL | 0 refills | Status: AC | PRN

## 2018-09-15 MED ORDER — ACETAMINOPHEN 500 MG PO TABS
ORAL_TABLET | ORAL | Status: AC
  Filled 2018-09-15: qty 2

## 2018-09-15 MED ORDER — DOCUSATE SODIUM 100 MG PO CAPS: 100.0000 mg | ORAL_CAPSULE | Freq: Two times a day (BID) | ORAL | 0 refills | Status: DC

## 2018-09-15 MED ORDER — SIMETHICONE 80 MG PO CHEW
CHEWABLE_TABLET | ORAL | Status: AC
  Filled 2018-09-15: qty 1

## 2018-09-15 MED ORDER — IBUPROFEN 800 MG PO TABS: 600.0000 mg | ORAL_TABLET | Freq: Four times a day (QID) | ORAL | 0 refills | Status: DC

## 2018-09-15 MED ORDER — OXYCODONE HCL 5 MG PO TABS
ORAL_TABLET | ORAL | Status: AC
  Filled 2018-09-15: qty 2

## 2018-09-15 NOTE — Progress Notes (Signed)
D; void 50cc in the hat, bladder scan done, 0 ml detected .

## 2018-09-15 NOTE — Discharge Summary (Signed)
Physician Discharge Summary  Patient ID: [name] MRN: [id number] DOB/AGE: 1955/10/06 63 y.o.  Admit date: 09/14/2018 Discharge date: 09/15/2018  Admission Diagnoses:  Discharge Diagnoses:  Active Problems:   Pelvic prolapse   History of gynecologic surgery   Discharged Condition: good  Hospital Course: Pt is a 63yo admitted for scheduled above surgery. She Had an uncomplicated mini lap BSO, LS, LOA, A/P repair and cysto with an EBL of 100cc. She had an uncomplicated postoperative course and on POD#1 was meeting all goals such as ambulating, passing flatus, voiding freely, and tolerating a general diet and she was deemed stable for discharge home.    Consults: None  Significant Diagnostic Studies: labs:  CBC    Component Value Date/Time   WBC 15.3 (H) 09/15/2018 0523   RBC 3.83 (L) 09/15/2018 0523   HGB 11.7 (L) 09/15/2018 0523   HCT 35.2 (L) 09/15/2018 0523   PLT 239 09/15/2018 0523   MCV 91.9 09/15/2018 0523   MCH 30.5 09/15/2018 0523   MCHC 33.2 09/15/2018 0523   RDW 12.5 09/15/2018 0523   LYMPHSABS 2.9 07/20/2013 1451   MONOABS 0.6 07/20/2013 1451   EOSABS 0.2 07/20/2013 1451   BASOSABS 0.1 07/20/2013 1451     Treatments: surgery: see above  Discharge Exam: Blood pressure (!) 85/55, pulse 61, temperature 97.8 F (36.6 C), resp. rate 15, height 5\' 7"  (1.702 m), weight 81.8 kg, SpO2 100 %. General appearance: alert, cooperative and appears stated age Resp: clear to auscultation bilaterally Cardio: regular rate and rhythm, S1, S2 normal, no murmur, click, rub or gallop GI: soft, non-tender; bowel sounds normal; no masses,  no organomegaly Extremities: extremities normal, atraumatic, no cyanosis or edema Incision/Wound: c/d/i, minimal vaginal bleeding  Disposition: Discharge disposition: 01-Home or Self Care       Discharge Instructions     Remove dressing in 72 hours   Complete by: As directed    Call MD for:   Complete by: As directed     Heavy vaginal bleeding or abnormal vaginal discharge   Call MD for:  difficulty breathing, headache or visual disturbances   Complete by: As directed    Call MD for:  persistant nausea and vomiting   Complete by: As directed    Call MD for:  redness, tenderness, or signs of infection (pain, swelling, redness, odor or green/yellow discharge around incision site)   Complete by: As directed    Call MD for:  severe uncontrolled pain   Complete by: As directed    Call MD for:  temperature >100.4   Complete by: As directed    Diet general   Complete by: As directed    Driving Restrictions   Complete by: As directed    Do not drive until you are off narcotic pain medications and you feel like you can react in an emergency.   Increase activity slowly   Complete by: As directed    Lifting restrictions   Complete by: As directed    Don't lift anything more than 15-20 pounds   Sexual Activity Restrictions   Complete by: As directed    Nothing in the vagina x 2 to 6 weeks. We will discuss at clinic visit.     Allergies as of 09/15/2018      Reactions   Butrans [buprenorphine Hcl] Swelling   Codeine    REACTION: stomach pain   Doxycycline    REACTION: rash   Hydrocodone    REACTION: stomach pain  Medication List    TAKE these medications   diphenhydrAMINE 25 mg capsule Commonly known as: BENADRYL Take 25 mg by mouth every 6 (six) hours as needed for allergies.   docusate sodium 100 MG capsule Commonly known as: COLACE Take 1 capsule (100 mg total) by mouth 2 (two) times daily.   ibuprofen 800 MG tablet Commonly known as: ADVIL Take 1 tablet (800 mg total) by mouth every 6 (six) hours. What changed:   medication strength  how much to take  when to take this  reasons to take this   oxyCODONE 5 MG immediate release tablet Commonly known as: Oxy IR/ROXICODONE Take 1 tablet (5 mg total) by mouth every 4 (four) hours as needed for up to 7 days for moderate pain.    vitamin C 1000 MG tablet Take 1,000 mg by mouth daily.        Signed: Tyson Dense 09/15/2018, 9:51 AM

## 2018-09-15 NOTE — Progress Notes (Signed)
D; removed vaginal packing without difficulty. Removed f-cath @ 5am.

## 2018-09-15 NOTE — Progress Notes (Signed)
Notified Dr Royston Sinner regarding bladder scan and void, She will put dc orders.

## 2019-11-12 ENCOUNTER — Ambulatory Visit (HOSPITAL_COMMUNITY)
Admission: RE | Admit: 2019-11-12 | Discharge: 2019-11-12 | Disposition: A | Discharge status: Home / Self Care | Payer: BC Managed Care – PPO | Source: Ambulatory Visit | Attending: Pulmonary Disease | Admitting: Pulmonary Disease

## 2019-11-12 ENCOUNTER — Other Ambulatory Visit (HOSPITAL_COMMUNITY): Payer: Self-pay | Admitting: Nurse Practitioner

## 2019-11-12 DIAGNOSIS — U071 COVID-19: Secondary | ICD-10-CM | POA: Insufficient documentation

## 2019-11-12 MED ORDER — FAMOTIDINE IN NACL 20-0.9 MG/50ML-% IV SOLN: 20.0000 mg | Freq: Once | INTRAVENOUS | Status: DC | PRN

## 2019-11-12 MED ORDER — EPINEPHRINE 0.3 MG/0.3ML IJ SOAJ: 0.3000 mg | Freq: Once | INTRAMUSCULAR | Status: DC | PRN

## 2019-11-12 MED ORDER — ONDANSETRON HCL 4 MG/2ML IJ SOLN
4.0000 mg | Freq: Once | INTRAMUSCULAR | Status: AC
  Administered 2019-11-12: 4 mg via INTRAVENOUS
  Filled 2019-11-12: qty 2

## 2019-11-12 MED ORDER — ALBUTEROL SULFATE HFA 108 (90 BASE) MCG/ACT IN AERS: 2.0000 | INHALATION_SPRAY | Freq: Once | RESPIRATORY_TRACT | Status: DC | PRN

## 2019-11-12 MED ORDER — SODIUM CHLORIDE 0.9 % IV SOLN: INTRAVENOUS | Status: DC | PRN

## 2019-11-12 MED ORDER — METHYLPREDNISOLONE SODIUM SUCC 125 MG IJ SOLR: 125.0000 mg | Freq: Once | INTRAMUSCULAR | Status: DC | PRN

## 2019-11-12 MED ORDER — DIPHENHYDRAMINE HCL 50 MG/ML IJ SOLN: 50.0000 mg | Freq: Once | INTRAMUSCULAR | Status: DC | PRN

## 2019-11-12 MED ORDER — SODIUM CHLORIDE 0.9 % IV SOLN
1200.0000 mg | Freq: Once | INTRAVENOUS | Status: AC
  Administered 2019-11-12: 1200 mg via INTRAVENOUS

## 2019-11-12 NOTE — Discharge Instructions (Signed)
10 Things You Can Do to Manage Your COVID-19 Symptoms at Home If you have possible or confirmed COVID-19: 1. Stay home from work and school. And stay away from other public places. If you must go out, avoid using any kind of public transportation, ridesharing, or taxis. 2. Monitor your symptoms carefully. If your symptoms get worse, call your healthcare provider immediately. 3. Get rest and stay hydrated. 4. If you have a medical appointment, call the healthcare provider ahead of time and tell them that you have or may have COVID-19. 5. For medical emergencies, call 911 and notify the dispatch personnel that you have or may have COVID-19. 6. Cover your cough and sneezes with a tissue or use the inside of your elbow. 7. Wash your hands often with soap and water for at least 20 seconds or clean your hands with an alcohol-based hand sanitizer that contains at least 60% alcohol. 8. As much as possible, stay in a specific room and away from other people in your home. Also, you should use a separate bathroom, if available. If you need to be around other people in or outside of the home, wear a mask. 9. Avoid sharing personal items with other people in your household, like dishes, towels, and bedding. 10. Clean all surfaces that are touched often, like counters, tabletops, and doorknobs. Use household cleaning sprays or wipes according to the label instructions. [email or web address] 08/16/2018 This information is not intended to replace advice given to you by your health care provider. Make sure you discuss any questions you have with your health care provider. Document Revised: 01/18/2019 Document Reviewed: 01/18/2019 Elsevier Patient Education  [place]. What types of side effects do monoclonal antibody drugs cause?  Common side effects  In general, the more common side effects caused by monoclonal antibody drugs include: . Allergic reactions, such as hives or itching . Flu-like signs and  symptoms, including chills, fatigue, fever, and muscle aches and pains . Nausea, vomiting . Diarrhea . Skin rashes . Low blood pressure   The CDC is recommending patients who receive monoclonal antibody treatments wait at least 90 days before being vaccinated.  Currently, there are no data on the safety and efficacy of mRNA COVID-19 vaccines in persons who received monoclonal antibodies or convalescent plasma as part of COVID-19 treatment. Based on the estimated half-life of such therapies as well as evidence suggesting that reinfection is uncommon in the 90 days after initial infection, vaccination should be deferred for at least 90 days, as a precautionary measure until additional information becomes available, to avoid interference of the antibody treatment with vaccine-induced immune responses.What types of side effects do monoclonal antibody drugs cause?  Common side effects  In general, the more common side effects caused by monoclonal antibody drugs include: . Allergic reactions, such as hives or itching . Flu-like signs and symptoms, including chills, fatigue, fever, and muscle aches and pains . Nausea, vomiting . Diarrhea . Skin rashes . Low blood pressure   The CDC is recommending patients who receive monoclonal antibody treatments wait at least 90 days before being vaccinated.  Currently, there are no data on the safety and efficacy of mRNA COVID-19 vaccines in persons who received monoclonal antibodies or convalescent plasma as part of COVID-19 treatment. Based on the estimated half-life of such therapies as well as evidence suggesting that reinfection is uncommon in the 90 days after initial infection, vaccination should be deferred for at least 90 days, as a precautionary measure until additional information becomes  available, to avoid interference of the antibody treatment with vaccine-induced immune responses.

## 2019-11-12 NOTE — Progress Notes (Signed)
I connected by phone with Jacinto Halim on 11/12/2019 at 2:57 PM to discuss the potential use of an new treatment for mild to moderate COVID-19 viral infection in non-hospitalized patients.  This patient is a 65 y.o. female that meets the FDA criteria for Emergency Use Authorization of casirivimab\imdevimab.  Has a (+) direct SARS-CoV-2 viral test result  Has mild or moderate COVID-19   Is ? 65 years of age and weighs ? 40 kg  Is NOT hospitalized due to COVID-19  Is NOT requiring oxygen therapy or requiring an increase in baseline oxygen flow rate due to COVID-19  Is within 10 days of symptom onset  Has at least one of the high risk factor(s) for progression to severe COVID-19 and/or hospitalization as defined in EUA.  Specific high risk criteria : BMI > 25   Onset of symptoms: ten days ago. Husband has tested positive and is currently hospitalized at [facility].    I have spoken and communicated the following to the patient or parent/caregiver:  1. FDA has authorized the emergency use of casirivimab\imdevimab for the treatment of mild to moderate COVID-19 in adults and pediatric patients with positive results of direct SARS-CoV-2 viral testing who are 81 years of age and older weighing at least 40 kg, and who are at high risk for progressing to severe COVID-19 and/or hospitalization.  2. The significant known and potential risks and benefits of casirivimab\imdevimab, and the extent to which such potential risks and benefits are unknown.  3. Information on available alternative treatments and the risks and benefits of those alternatives, including clinical trials.  4. Patients treated with casirivimab\imdevimab should continue to self-isolate and use infection control measures (e.g., wear mask, isolate, social distance, avoid sharing personal items, clean and disinfect high touch surfaces, and frequent handwashing) according to CDC guidelines.   5. The patient or parent/caregiver has  the option to accept or refuse casirivimab\imdevimab .  After reviewing this information with the patient, the patient has agreed to receive one of the available covid 19 monoclonal antibodies and will be provided an appropriate fact sheet prior to infusion.Beckey Rutter, [place], AGNP-C [phone] ([place])

## 2019-11-12 NOTE — Progress Notes (Signed)
Pt said she is unvaccinated and might consider vaccination on Dec 27.  Diagnosis: COVID-19  Physician: Dr. Joya Gaskins  Procedure: Covid Infusion Clinic Med: casirivimab\imdevimab infusion - Provided patient with casirivimab\imdevimab fact sheet for patients, parents and caregivers prior to infusion.  Complications: No immediate complications noted.  Discharge: Discharged home   Tia Masker 11/12/2019

## 2020-04-08 ENCOUNTER — Telehealth: Payer: Self-pay | Admitting: Internal Medicine

## 2020-04-08 NOTE — Telephone Encounter (Signed)
Patients husband called and said before the pandemic he spoke with Dr. Alain Marion and asked if his wife could re establish care. His name is [employer]. Please advise

## 2020-04-08 NOTE — Telephone Encounter (Signed)
Okay.  Thanks.

## 2020-04-22 ENCOUNTER — Other Ambulatory Visit: Payer: Self-pay

## 2020-04-22 ENCOUNTER — Ambulatory Visit (INDEPENDENT_AMBULATORY_CARE_PROVIDER_SITE_OTHER): Payer: MEDICARE | Admitting: Internal Medicine

## 2020-04-22 ENCOUNTER — Encounter: Payer: Self-pay | Admitting: Internal Medicine

## 2020-04-22 VITALS — BP 142/84 | HR 80 | Temp 98.7°F

## 2020-04-22 DIAGNOSIS — D509 Iron deficiency anemia, unspecified: Secondary | ICD-10-CM | POA: Diagnosis not present

## 2020-04-22 DIAGNOSIS — R4189 Other symptoms and signs involving cognitive functions and awareness: Secondary | ICD-10-CM

## 2020-04-22 DIAGNOSIS — E559 Vitamin D deficiency, unspecified: Secondary | ICD-10-CM | POA: Diagnosis not present

## 2020-04-22 DIAGNOSIS — K219 Gastro-esophageal reflux disease without esophagitis: Secondary | ICD-10-CM

## 2020-04-22 DIAGNOSIS — E538 Deficiency of other specified B group vitamins: Secondary | ICD-10-CM | POA: Diagnosis not present

## 2020-04-22 DIAGNOSIS — E2740 Unspecified adrenocortical insufficiency: Secondary | ICD-10-CM | POA: Diagnosis not present

## 2020-04-22 DIAGNOSIS — U071 COVID-19: Secondary | ICD-10-CM

## 2020-04-22 MED ORDER — FAMOTIDINE 40 MG PO TABS: 40.0000 mg | ORAL_TABLET | Freq: Every day | ORAL | 3 refills | Status: DC

## 2020-04-22 NOTE — Progress Notes (Signed)
Subjective:  Patient ID: [name], female    DOB: 04/20/1955  Age: 65 y.o. MRN: [id number]  CC: Establish Care   HPI [name] presents for adrenal insufficiency, B12 and Vit D deficiency Covid in Nov 10, 2022 - c/o brain fog Mom died in 05-10-18 The patient was not seen in the office for over 3 years-is a new patient again  Outpatient Medications Prior to Visit  Medication Sig Dispense Refill   Ascorbic Acid (VITAMIN C) 1000 MG tablet Take 1,000 mg by mouth daily.     diphenhydrAMINE (BENADRYL) 25 mg capsule Take 25 mg by mouth every 6 (six) hours as needed for allergies.     zinc gluconate 50 MG tablet Take 50 mg by mouth daily.     docusate sodium (COLACE) 100 MG capsule Take 1 capsule (100 mg total) by mouth 2 (two) times daily. 60 capsule 0   ibuprofen (ADVIL) 800 MG tablet Take 1 tablet (800 mg total) by mouth every 6 (six) hours. (Patient not taking: Reported on 04/22/2020) 30 tablet 0   No facility-administered medications prior to visit.    ROS: Review of Systems  Constitutional:  Positive for fatigue. Negative for activity change, appetite change, chills and unexpected weight change.  HENT:  Negative for congestion, mouth sores and sinus pressure.   Eyes:  Negative for visual disturbance.  Respiratory:  Negative for cough and chest tightness.   Gastrointestinal:  Negative for abdominal pain and nausea.  Genitourinary:  Negative for difficulty urinating, frequency and vaginal pain.  Musculoskeletal:  Negative for back pain and gait problem.  Skin:  Negative for pallor and rash.  Neurological:  Negative for dizziness, tremors, weakness, numbness and headaches.  Psychiatric/Behavioral:  Positive for decreased concentration. Negative for confusion, sleep disturbance and suicidal ideas.    Objective:  BP (!) 142/84 (BP Location: Left Arm)    Pulse 80    Temp 98.7 F (37.1 C) (Oral)    SpO2 99%   BP Readings from Last 3 Encounters:  04/22/20 (!) 142/84  11/12/19  118/71  09/15/18 (!) 85/55    Wt Readings from Last 3 Encounters:  09/14/18 180 lb 6.4 oz (81.8 kg)  10/07/06 144 lb (65.3 kg)    Physical Exam Constitutional:      General: She is not in acute distress.    Appearance: She is well-developed. She is obese.  HENT:     Head: Normocephalic.     Right Ear: External ear normal.     Left Ear: External ear normal.     Nose: Nose normal.  Eyes:     General:        Right eye: No discharge.        Left eye: No discharge.     Conjunctiva/sclera: Conjunctivae normal.     Pupils: Pupils are equal, round, and reactive to light.  Neck:     Thyroid: No thyromegaly.     Vascular: No JVD.     Trachea: No tracheal deviation.  Cardiovascular:     Rate and Rhythm: Normal rate and regular rhythm.     Heart sounds: Normal heart sounds.  Pulmonary:     Effort: No respiratory distress.     Breath sounds: No stridor. No wheezing.  Abdominal:     General: Bowel sounds are normal. There is no distension.     Palpations: Abdomen is soft. There is no mass.     Tenderness: There is no abdominal tenderness. There is no guarding or  rebound.  Musculoskeletal:        General: No tenderness.     Cervical back: Normal range of motion and neck supple. No rigidity.  Lymphadenopathy:     Cervical: No cervical adenopathy.  Skin:    Findings: No erythema or rash.  Neurological:     Cranial Nerves: No cranial nerve deficit.     Motor: No abnormal muscle tone.     Coordination: Coordination normal.     Deep Tendon Reflexes: Reflexes normal.  Psychiatric:        Behavior: Behavior normal.        Thought Content: Thought content normal.        Judgment: Judgment normal.    Lab Results  Component Value Date   WBC 15.3 (H) 09/15/2018   HGB 11.7 (L) 09/15/2018   HCT 35.2 (L) 09/15/2018   PLT 239 09/15/2018   GLUCOSE 95 01/18/2014   CHOL 197 01/07/2010   TRIG 123.0 01/07/2010   HDL 89.90 01/07/2010   LDLCALC 83 01/07/2010   ALT 20 01/18/2014   AST  24 01/18/2014   NA 136 01/18/2014   K 3.6 01/18/2014   CL 102 01/18/2014   CREATININE 0.8 01/18/2014   BUN 6 01/18/2014   CO2 27 01/18/2014   TSH 1.52 01/18/2014   INR 0.96 07/03/2009   HGBA1C 5.7 07/20/2013    No results found.  Assessment & Plan:    Walker Kehr, MD

## 2020-04-22 NOTE — Assessment & Plan Note (Signed)
Off Cortef x several years

## 2020-04-22 NOTE — Assessment & Plan Note (Signed)
On B12 - not taking it in ages

## 2020-04-22 NOTE — Assessment & Plan Note (Signed)
Worse Pepcid

## 2020-04-23 LAB — TSH: TSH: 1.29 u[IU]/mL (ref 0.35–4.50)

## 2020-04-23 LAB — LIPID PANEL
Cholesterol: 207 mg/dL — ABNORMAL HIGH (ref 0–200)
HDL: 61.7 mg/dL (ref >39.00)
LDL Cholesterol: 109 mg/dL — ABNORMAL HIGH (ref 0–99)
NonHDL: 145.52
Total CHOL/HDL Ratio: 3
Triglycerides: 184 mg/dL — ABNORMAL HIGH (ref 0.0–149.0)
VLDL: 36.8 mg/dL (ref 0.0–40.0)

## 2020-04-23 LAB — CBC WITH DIFFERENTIAL/PLATELET
Basophils Absolute: 0.1 10*3/uL (ref 0.0–0.1)
Basophils Relative: 1.2 % (ref 0.0–3.0)
Eosinophils Absolute: 0.4 10*3/uL (ref 0.0–0.7)
Eosinophils Relative: 5.4 % — ABNORMAL HIGH (ref 0.0–5.0)
HCT: 40.5 % (ref 36.0–46.0)
Hemoglobin: 13.7 g/dL (ref 12.0–15.0)
Lymphocytes Relative: 34 % (ref 12.0–46.0)
Lymphs Abs: 2.5 10*3/uL (ref 0.7–4.0)
MCHC: 33.8 g/dL (ref 30.0–36.0)
MCV: 89.7 fl (ref 78.0–100.0)
Monocytes Absolute: 0.7 10*3/uL (ref 0.1–1.0)
Monocytes Relative: 9.4 % (ref 3.0–12.0)
Neutro Abs: 3.6 10*3/uL (ref 1.4–7.7)
Neutrophils Relative %: 50 % (ref 43.0–77.0)
Platelets: 255 10*3/uL (ref 150.0–400.0)
RBC: 4.52 Mil/uL (ref 3.87–5.11)
RDW: 12.6 % (ref 11.5–15.5)
WBC: 7.2 10*3/uL (ref 4.0–10.5)

## 2020-04-23 LAB — COMPREHENSIVE METABOLIC PANEL
ALT: 19 U/L (ref 0–35)
AST: 20 U/L (ref 0–37)
Albumin: 4.4 g/dL (ref 3.5–5.2)
Alkaline Phosphatase: 69 U/L (ref 39–117)
BUN: 10 mg/dL (ref 6–23)
CO2: 23 mEq/L (ref 19–32)
Calcium: 9.2 mg/dL (ref 8.4–10.5)
Chloride: 104 mEq/L (ref 96–112)
Creatinine, Ser: 0.87 mg/dL (ref 0.40–1.20)
GFR: 69.98 mL/min (ref >60.00)
Glucose, Bld: 87 mg/dL (ref 70–99)
Potassium: 4 mEq/L (ref 3.5–5.1)
Sodium: 138 mEq/L (ref 135–145)
Total Bilirubin: 0.4 mg/dL (ref 0.2–1.2)
Total Protein: 7.2 g/dL (ref 6.0–8.3)

## 2020-04-23 LAB — IRON,TIBC AND FERRITIN PANEL
%SAT: 23 % (calc) (ref 16–45)
Ferritin: 77 ng/mL (ref 16–288)
Iron: 70 ug/dL (ref 45–160)
TIBC: 309 mcg/dL (calc) (ref 250–450)

## 2020-04-23 LAB — URINALYSIS
Bilirubin Urine: NEGATIVE
Hgb urine dipstick: NEGATIVE
Ketones, ur: NEGATIVE
Leukocytes,Ua: NEGATIVE
Nitrite: NEGATIVE
Specific Gravity, Urine: 1.01 (ref 1.000–1.030)
Total Protein, Urine: NEGATIVE
Urine Glucose: NEGATIVE
Urobilinogen, UA: 0.2 (ref 0.0–1.0)
pH: 7 (ref 5.0–8.0)

## 2020-04-23 LAB — VITAMIN D 25 HYDROXY (VIT D DEFICIENCY, FRACTURES): VITD: 24.49 ng/mL — ABNORMAL LOW (ref 30.00–100.00)

## 2020-04-23 LAB — H. PYLORI ANTIBODY, IGG: H Pylori IgG: NEGATIVE

## 2020-04-23 LAB — VITAMIN B12: Vitamin B-12: 185 pg/mL — ABNORMAL LOW (ref 211–911)

## 2020-04-23 LAB — T4, FREE: Free T4: 0.91 ng/dL (ref 0.60–1.60)

## 2020-04-23 LAB — CORTISOL: Cortisol, Plasma: 9.1 ug/dL

## 2020-04-24 ENCOUNTER — Other Ambulatory Visit: Payer: Self-pay | Admitting: Internal Medicine

## 2020-04-24 ENCOUNTER — Telehealth: Payer: Self-pay | Admitting: *Deleted

## 2020-04-24 MED ORDER — VITAMIN B-12 1000 MCG SL SUBL: 1.0000 | SUBLINGUAL_TABLET | Freq: Every day | SUBLINGUAL | 3 refills | Status: DC

## 2020-04-24 MED ORDER — VITAMIN D3 50 MCG (2000 UT) PO CAPS: 2000.0000 [IU] | ORAL_CAPSULE | Freq: Every day | ORAL | 3 refills | Status: DC

## 2020-04-24 MED ORDER — VITAMIN D3 1.25 MG (50000 UT) PO CAPS: 1.0000 | ORAL_CAPSULE | ORAL | 0 refills | Status: DC

## 2020-04-24 NOTE — Telephone Encounter (Signed)
Tried calling pt to let her know that her jury duty letter is ready for p/u/ there was no answer LMOM will leave at front desk for pick-up.Marland KitchenJohny Chess

## 2020-06-03 ENCOUNTER — Other Ambulatory Visit: Payer: Self-pay | Admitting: Internal Medicine

## 2020-06-03 ENCOUNTER — Other Ambulatory Visit: Payer: Self-pay

## 2020-06-05 ENCOUNTER — Ambulatory Visit (INDEPENDENT_AMBULATORY_CARE_PROVIDER_SITE_OTHER): Payer: MEDICARE

## 2020-06-05 ENCOUNTER — Other Ambulatory Visit: Payer: Self-pay

## 2020-06-05 ENCOUNTER — Ambulatory Visit (INDEPENDENT_AMBULATORY_CARE_PROVIDER_SITE_OTHER): Payer: MEDICARE | Admitting: Internal Medicine

## 2020-06-05 DIAGNOSIS — S93401D Sprain of unspecified ligament of right ankle, subsequent encounter: Secondary | ICD-10-CM

## 2020-06-05 DIAGNOSIS — J209 Acute bronchitis, unspecified: Secondary | ICD-10-CM | POA: Diagnosis not present

## 2020-06-05 DIAGNOSIS — R059 Cough, unspecified: Secondary | ICD-10-CM

## 2020-06-05 DIAGNOSIS — E538 Deficiency of other specified B group vitamins: Secondary | ICD-10-CM | POA: Diagnosis not present

## 2020-06-05 MED ORDER — PROMETHAZINE-CODEINE 6.25-10 MG/5ML PO SYRP: 5.0000 mL | ORAL_SOLUTION | ORAL | 0 refills | Status: DC | PRN

## 2020-06-05 MED ORDER — CEFDINIR 300 MG PO CAPS: 300.0000 mg | ORAL_CAPSULE | Freq: Two times a day (BID) | ORAL | 0 refills | Status: DC

## 2020-06-05 NOTE — Progress Notes (Signed)
Subjective:  Patient ID: [name], female    DOB: Jul 06, 1955  Age: 65 y.o. MRN: [id number]  CC: Cough (Pt states she had cough for 4 weeks now. Started out coughing up phlegm, but now its just a dry persistent cough)   HPI [name] presents for cough x 4 weeks - not better. Some SOB. Had wheezing   Outpatient Medications Prior to Visit  Medication Sig Dispense Refill  . Cholecalciferol (VITAMIN D3) 50 MCG (2000 UT) capsule Take 1 capsule (2,000 Units total) by mouth daily. 100 capsule 3  . Cyanocobalamin (VITAMIN B-12) 1000 MCG SUBL Place 1 tablet (1,000 mcg total) under the tongue daily. 100 tablet 3  . diphenhydrAMINE (BENADRYL) 25 mg capsule Take 25 mg by mouth every 6 (six) hours as needed for allergies.    . famotidine (PEPCID) 40 MG tablet Take 1 tablet (40 mg total) by mouth daily. 90 tablet 3   No facility-administered medications prior to visit.    ROS: Review of Systems  Constitutional: Positive for fatigue. Negative for activity change, appetite change, chills and unexpected weight change.  HENT: Negative for congestion, mouth sores and sinus pressure.   Eyes: Negative for visual disturbance.  Respiratory: Positive for cough. Negative for chest tightness, shortness of breath and wheezing.   Gastrointestinal: Negative for abdominal pain and nausea.  Genitourinary: Negative for difficulty urinating, frequency and vaginal pain.  Musculoskeletal: Negative for back pain and gait problem.  Skin: Negative for pallor and rash.  Neurological: Negative for dizziness, tremors, weakness, numbness and headaches.  Psychiatric/Behavioral: Negative for confusion and sleep disturbance.    Objective:  There were no vitals taken for this visit.  BP Readings from Last 3 Encounters:  04/22/20 (!) 142/84  11/12/19 118/71  09/15/18 (!) 85/55    Wt Readings from Last 3 Encounters:  09/14/18 180 lb 6.4 oz (81.8 kg)  10/07/06 144 lb (65.3 kg)    Physical  Exam Constitutional:      General: She is not in acute distress.    Appearance: She is well-developed.  HENT:     Head: Normocephalic.     Right Ear: External ear normal.     Left Ear: External ear normal.     Nose: Nose normal.  Eyes:     General:        Right eye: No discharge.        Left eye: No discharge.     Conjunctiva/sclera: Conjunctivae normal.     Pupils: Pupils are equal, round, and reactive to light.  Neck:     Thyroid: No thyromegaly.     Vascular: No JVD.     Trachea: No tracheal deviation.  Cardiovascular:     Rate and Rhythm: Normal rate and regular rhythm.     Heart sounds: Normal heart sounds.  Pulmonary:     Effort: No respiratory distress.     Breath sounds: No stridor. No wheezing.  Abdominal:     General: Bowel sounds are normal. There is no distension.     Palpations: Abdomen is soft. There is no mass.     Tenderness: There is no abdominal tenderness. There is no guarding or rebound.  Musculoskeletal:        General: No tenderness.     Cervical back: Normal range of motion and neck supple.  Lymphadenopathy:     Cervical: No cervical adenopathy.  Skin:    Findings: No erythema or rash.  Neurological:     Mental Status:  She is oriented to person, place, and time.     Cranial Nerves: No cranial nerve deficit.     Motor: No abnormal muscle tone.     Coordination: Coordination normal.     Deep Tendon Reflexes: Reflexes normal.  Psychiatric:        Behavior: Behavior normal.        Thought Content: Thought content normal.        Judgment: Judgment normal.     R ankle w/ACE   Lab Results  Component Value Date   WBC 7.2 04/22/2020   HGB 13.7 04/22/2020   HCT 40.5 04/22/2020   PLT 255.0 04/22/2020   GLUCOSE 87 04/22/2020   CHOL 207 (H) 04/22/2020   TRIG 184.0 (H) 04/22/2020   HDL 61.70 04/22/2020   LDLCALC 109 (H) 04/22/2020   ALT 19 04/22/2020   AST 20 04/22/2020   NA 138 04/22/2020   K 4.0 04/22/2020   CL 104 04/22/2020   CREATININE  0.87 04/22/2020   BUN 10 04/22/2020   CO2 23 04/22/2020   TSH 1.29 04/22/2020   INR 0.96 07/03/2009   HGBA1C 5.7 07/20/2013    No results found.  Assessment & Plan:    Follow-up: No follow-ups on file.  Walker Kehr, MD

## 2020-06-05 NOTE — Assessment & Plan Note (Signed)
Chronic Risks associated with treatment noncompliance were discussed. Compliance was encouraged.

## 2020-06-09 ENCOUNTER — Encounter: Payer: Self-pay | Admitting: Internal Medicine

## 2020-06-09 DIAGNOSIS — S93409A Sprain of unspecified ligament of unspecified ankle, initial encounter: Secondary | ICD-10-CM | POA: Insufficient documentation

## 2020-06-09 DIAGNOSIS — R059 Cough, unspecified: Secondary | ICD-10-CM | POA: Insufficient documentation

## 2020-06-09 NOTE — Assessment & Plan Note (Signed)
ACE wrap 

## 2020-06-09 NOTE — Assessment & Plan Note (Signed)
Worse Cefdinir Prom-cod syr CXR

## 2020-06-24 ENCOUNTER — Telehealth: Payer: Self-pay

## 2020-06-30 ENCOUNTER — Telehealth: Payer: Self-pay | Admitting: Internal Medicine

## 2020-06-30 NOTE — Telephone Encounter (Signed)
   Seeking advice  Patient calling to report since last visit her cough has gotten worse.  She thought cough was gone but  It "came back with a vengeance" Recent negative covid test   Patient seeking advice

## 2020-07-01 NOTE — Telephone Encounter (Signed)
Notified pt w/MD response. Pt states she may be dead by then. Offered appt with next available provider which would be w/Dr. Jenny Reichmann 5/19. Pt states she will call back to schedule...Johny Chess

## 2020-07-01 NOTE — Telephone Encounter (Signed)
Please schedule office visit this week.  Thank you

## 2020-07-21 ENCOUNTER — Other Ambulatory Visit: Payer: Self-pay

## 2020-07-22 ENCOUNTER — Ambulatory Visit (INDEPENDENT_AMBULATORY_CARE_PROVIDER_SITE_OTHER): Payer: MEDICARE | Admitting: Internal Medicine

## 2020-07-22 DIAGNOSIS — J0101 Acute recurrent maxillary sinusitis: Secondary | ICD-10-CM

## 2020-07-22 DIAGNOSIS — E538 Deficiency of other specified B group vitamins: Secondary | ICD-10-CM | POA: Diagnosis not present

## 2020-07-22 MED ORDER — METHYLPREDNISOLONE 4 MG PO TBPK: ORAL_TABLET | ORAL | 0 refills | Status: DC

## 2020-07-22 MED ORDER — CEFDINIR 300 MG PO CAPS: 300.0000 mg | ORAL_CAPSULE | Freq: Two times a day (BID) | ORAL | 0 refills | Status: DC

## 2020-07-22 MED ORDER — CEFDINIR 300 MG PO CAPS
300.0000 mg | ORAL_CAPSULE | Freq: Two times a day (BID) | ORAL | 0 refills | Status: DC
Start: 2020-07-22 — End: 2020-12-25

## 2020-07-22 MED ORDER — NEOMYCIN-POLYMYXIN-HC 3.5-10000-1 OT SOLN: 3.0000 [drp] | Freq: Three times a day (TID) | OTIC | 0 refills | Status: AC

## 2020-07-22 MED ORDER — KETOROLAC TROMETHAMINE 60 MG/2ML IM SOLN
60.0000 mg | Freq: Once | INTRAMUSCULAR | Status: AC
  Administered 2020-07-22: 60 mg via INTRAMUSCULAR

## 2020-07-22 MED ORDER — CEFDINIR 300 MG PO CAPS
300.0000 mg | ORAL_CAPSULE | Freq: Two times a day (BID) | ORAL | 0 refills | Status: DC
Start: 2020-07-22 — End: 2020-07-22

## 2020-07-22 NOTE — Progress Notes (Signed)
Subjective:  Patient ID: [name], female    DOB: 1955-04-05  Age: 65 y.o. MRN: [id number]  CC: Follow-up (3 month follow up. Pt states that she has had  headache for a month and nasal congestion.)   HPI [name] presents for sinusitis complaints, HA x 4 weeks  Outpatient Medications Prior to Visit  Medication Sig Dispense Refill   cefdinir (OMNICEF) 300 MG capsule Take 1 capsule (300 mg total) by mouth 2 (two) times daily. 20 capsule 0   Cholecalciferol (VITAMIN D3) 50 MCG (2000 UT) capsule Take 1 capsule (2,000 Units total) by mouth daily. 100 capsule 3   Cyanocobalamin (VITAMIN B-12) 1000 MCG SUBL Place 1 tablet (1,000 mcg total) under the tongue daily. 100 tablet 3   diphenhydrAMINE (BENADRYL) 25 mg capsule Take 25 mg by mouth every 6 (six) hours as needed for allergies.     famotidine (PEPCID) 40 MG tablet Take 1 tablet (40 mg total) by mouth daily. 90 tablet 3   promethazine-codeine (PHENERGAN WITH CODEINE) 6.25-10 MG/5ML syrup Take 5 mLs by mouth every 4 (four) hours as needed. 300 mL 0   No facility-administered medications prior to visit.    ROS: Review of Systems  Constitutional:  Positive for fatigue. Negative for activity change, appetite change, chills and unexpected weight change.  HENT:  Positive for hearing loss, postnasal drip, sinus pressure and sinus pain. Negative for congestion and mouth sores.   Eyes:  Negative for visual disturbance.  Respiratory:  Negative for cough, chest tightness and wheezing.   Gastrointestinal:  Negative for abdominal pain and nausea.  Genitourinary:  Negative for difficulty urinating, frequency and vaginal pain.  Musculoskeletal:  Negative for back pain and gait problem.  Skin:  Negative for pallor and rash.  Neurological:  Positive for headaches. Negative for dizziness, tremors, syncope, weakness and numbness.  Psychiatric/Behavioral:  Negative for confusion and sleep disturbance.    Objective:  BP 132/90    Pulse 70   Temp 98.1 F (36.7 C) (Oral)   Ht 5\' 7"  (1.702 m)   SpO2 98%   BMI 28.25 kg/m   BP Readings from Last 3 Encounters:  07/22/20 132/90  04/22/20 (!) 142/84  11/12/19 118/71    Wt Readings from Last 3 Encounters:  09/14/18 180 lb 6.4 oz (81.8 kg)  10/07/06 144 lb (65.3 kg)    Physical Exam Constitutional:      General: She is not in acute distress.    Appearance: She is well-developed. She is obese.  HENT:     Head: Normocephalic.     Right Ear: External ear normal.     Left Ear: External ear normal.     Nose: Nose normal.  Eyes:     General:        Right eye: No discharge.        Left eye: No discharge.     Conjunctiva/sclera: Conjunctivae normal.     Pupils: Pupils are equal, round, and reactive to light.  Neck:     Thyroid: No thyromegaly.     Vascular: No JVD.     Trachea: No tracheal deviation.  Cardiovascular:     Rate and Rhythm: Normal rate and regular rhythm.     Heart sounds: Normal heart sounds.  Pulmonary:     Effort: No respiratory distress.     Breath sounds: No stridor. No wheezing.  Abdominal:     General: Bowel sounds are normal. There is no distension.  Palpations: Abdomen is soft. There is no mass.     Tenderness: There is no abdominal tenderness. There is no guarding or rebound.  Musculoskeletal:        General: No tenderness.     Cervical back: Normal range of motion and neck supple. No rigidity.  Lymphadenopathy:     Cervical: No cervical adenopathy.  Skin:    Findings: No erythema or rash.  Neurological:     Cranial Nerves: No cranial nerve deficit.     Motor: No abnormal muscle tone.     Coordination: Coordination normal.     Deep Tendon Reflexes: Reflexes normal.  Psychiatric:        Behavior: Behavior normal.        Thought Content: Thought content normal.        Judgment: Judgment normal.    Lab Results  Component Value Date   WBC 7.2 04/22/2020   HGB 13.7 04/22/2020   HCT 40.5 04/22/2020   PLT 255.0  04/22/2020   GLUCOSE 87 04/22/2020   CHOL 207 (H) 04/22/2020   TRIG 184.0 (H) 04/22/2020   HDL 61.70 04/22/2020   LDLCALC 109 (H) 04/22/2020   ALT 19 04/22/2020   AST 20 04/22/2020   NA 138 04/22/2020   K 4.0 04/22/2020   CL 104 04/22/2020   CREATININE 0.87 04/22/2020   BUN 10 04/22/2020   CO2 23 04/22/2020   TSH 1.29 04/22/2020   INR 0.96 07/03/2009   HGBA1C 5.7 07/20/2013    No results found.  Assessment & Plan:    Follow-up: No follow-ups on file.  Walker Kehr, MD

## 2020-07-22 NOTE — Assessment & Plan Note (Signed)
Omnicef x 2 wks Medrol pack

## 2020-07-22 NOTE — Assessment & Plan Note (Addendum)
Cont to take B12

## 2020-08-04 ENCOUNTER — Encounter: Payer: Self-pay | Admitting: Internal Medicine

## 2020-08-13 NOTE — Telephone Encounter (Signed)
error 

## 2020-12-15 ENCOUNTER — Telehealth: Payer: Self-pay | Admitting: Internal Medicine

## 2020-12-15 NOTE — Telephone Encounter (Signed)
Left message for patient to call me back at [phone] to schedule Medicare Annual Wellness Visit   No hx of AWV eligible as of 12/16/20  Please schedule at anytime with [facility] Advisor if patient calls the office back.    40 Minutes appointment   Any questions, please call me at [phone]

## 2020-12-18 ENCOUNTER — Ambulatory Visit: Payer: MEDICARE

## 2020-12-25 ENCOUNTER — Encounter: Payer: Self-pay | Admitting: Family Medicine

## 2020-12-25 ENCOUNTER — Telehealth (INDEPENDENT_AMBULATORY_CARE_PROVIDER_SITE_OTHER): Payer: MEDICARE | Admitting: Family Medicine

## 2020-12-25 DIAGNOSIS — R509 Fever, unspecified: Secondary | ICD-10-CM

## 2020-12-25 DIAGNOSIS — R059 Cough, unspecified: Secondary | ICD-10-CM

## 2020-12-25 MED ORDER — AMOXICILLIN-POT CLAVULANATE 875-125 MG PO TABS: 1.0000 | ORAL_TABLET | Freq: Two times a day (BID) | ORAL | 0 refills | Status: DC

## 2020-12-25 MED ORDER — BENZONATATE 100 MG PO CAPS: ORAL_CAPSULE | ORAL | 0 refills | Status: DC

## 2020-12-25 NOTE — Progress Notes (Signed)
Virtual Visit via Video Note  I connected with Laurelai  on 12/25/20 at  5:40 PM EST by a video enabled telemedicine application and verified that I am speaking with the correct person using two identifiers.  Location patient: home,  Location provider:work or home office Persons participating in the virtual visit: patient, provider  I discussed the limitations of evaluation and management by telemedicine and the availability of in person appointments. The patient expressed understanding and agreed to proceed.   HPI:  Acute telemedicine visit for  cough and congestion: -Onset: about 10 days ago -Symptoms include:cough, congestion, now worsening with low grade fevers and worsening cough the last 4 days, thick mucus, discolored - light green -tested negative for covid -Denies:NVD, CP, SOB -Has tried: musinex, tusionex -Pertinent past medical history:see below -Pertinent medication allergies:  Allergies  Allergen Reactions   Butrans [Buprenorphine Hcl] Swelling   Buprenorphine Other (See Comments) and Swelling   Codeine     REACTION: nausea and stomach pain if mixed w/acetominophem, Can take w/o APAP   Doxycycline     REACTION: rash   Hydrocodone-Acetaminophen Nausea And Vomiting and Nausea Only    EACTION: nausea and stomach pain if mixed w/acetominophem, Can take w/o APAP    Other Other (See Comments)    Plastic tape - skin blisters   Oxycodone-Acetaminophen Nausea Only  -COVID-19 vaccine status:  Immunization History  Administered Date(s) Administered   Influenza Split 01/04/2012   Influenza Whole 01/11/2007, 11/14/2007, 01/13/2010   Influenza,inj,Quad PF,6+ Mos 01/12/2013, 01/18/2014    ROS: See pertinent positives and negatives per HPI.  Past Medical History:  Diagnosis Date   Adrenal insufficiency ([place])    post-viral   Balance problem 23-May-2006   Likely central   GERD (gastroesophageal reflux disease)    Gout    Grief    Son died 05-22-2005   Hyponatremia 05-23-06    Hypotension    Poss adrenal insuff. ? Viral encephalitis vm [place] May 23, 2006   Osteoarthritis    LEFT KNEE   PONV (postoperative nausea and vomiting)    Skin cancer 2009-05-22   Vitamin B 12 deficiency May 23, 2007   Borderline    Past Surgical History:  Procedure Laterality Date   ANTERIOR AND POSTERIOR REPAIR N/A 09/14/2018   Procedure: ANTERIOR (CYSTOCELE) AND POSTERIOR REPAIR (RECTOCELE);  Surgeon: Tyson Dense, MD;  Location[facility];  Service: Gynecology;  Laterality: N/A;   CYSTOSCOPY N/A 09/14/2018   Procedure: CYSTOSCOPY;  Surgeon: Tyson Dense, MD;  Location: [facility];  Service: Gynecology;  Laterality: N/A;   GIANT CELL CYST REMOVAL  05/30/09   Left 2nd dorsal MCP   LAPAROTOMY Bilateral 09/14/2018   Procedure: OPEN LAPAROTOMY, LEFT SALPINGO-OOPHORECTOMY, RIGHT OOPHORECTOMY;  Surgeon: Tyson Dense, MD;  Location: [facility];  Service: Gynecology;  Laterality: Bilateral;   LYSIS OF ADHESION N/A 09/14/2018   Procedure: LYSIS OF ADHESIONS;  Surgeon: Tyson Dense, MD;  Location: [facility];  Service: Gynecology;  Laterality: N/A;   TARSAL TUNNEL RELEASE  05/22/2009   Right Dr Sharol Given   WRIST SURGERY     Right/ Dr Lucia Bitter 05/22/08     Current Outpatient Medications:    amoxicillin-clavulanate (AUGMENTIN) 875-125 MG tablet, Take 1 tablet by mouth 2 (two) times daily., Disp: 20 tablet, Rfl: 0   benzonatate (TESSALON PERLES) 100 MG capsule, 1-2 capsules up to twice daily as needed for cough, Disp: 30 capsule, Rfl: 0   Cyanocobalamin (VITAMIN B-12) 1000 MCG SUBL,  Place 1 tablet (1,000 mcg total) under the tongue daily., Disp: 100 tablet, Rfl: 3   diphenhydrAMINE (BENADRYL) 25 mg capsule, Take 25 mg by mouth every 6 (six) hours as needed for allergies., Disp: , Rfl:    famotidine (PEPCID) 40 MG tablet, Take 1 tablet (40 mg total) by mouth daily., Disp: 90 tablet, Rfl: 3  EXAM:  VITALS per patient if  applicable:  GENERAL: alert, oriented, appears well and in no acute distress  HEENT: atraumatic, conjunttiva clear, no obvious abnormalities on inspection of external nose and ears  NECK: normal movements of the head and neck  LUNGS: on inspection no signs of respiratory distress, breathing rate appears normal, no obvious gross SOB, gasping or wheezing  CV: no obvious cyanosis  MS: moves all visible extremities without noticeable abnormality  PSYCH/NEURO: pleasant and cooperative, no obvious depression or anxiety, speech and thought processing grossly intact  ASSESSMENT AND PLAN:  Discussed the following assessment and plan:  Cough, unspecified type  Fever, unspecified fever cause  -we discussed possible serious and likely etiologies, options for evaluation and workup, limitations of telemedicine visit vs in person visit, treatment, treatment risks and precautions. Pt is agreeable to treatment via telemedicine at this moment. She wants to try and empiric abx - reasonable given duration of symptoms with worsening and reported fever. Also tessalon for cough.  Advised to seek prompt in person care if worsening, new symptoms arise, or if is not improving with treatment. Discussed options for inperson care if PCP office not available. Did let this patient know that I only do telemedicine on Tuesdays and Thursdays for [place]. Advised to schedule follow up visit with PCP or [facility] if any further questions or concerns to avoid delays in care.   I discussed the assessment and treatment plan with the patient. The patient was provided an opportunity to ask questions and all were answered. The patient agreed with the plan and demonstrated an understanding of the instructions.     Lucretia Kern, DO

## 2020-12-25 NOTE — Patient Instructions (Signed)
-  I sent the medication(s) we discussed to your pharmacy: Meds ordered this encounter  Medications   amoxicillin-clavulanate (AUGMENTIN) 875-125 MG tablet    Sig: Take 1 tablet by mouth 2 (two) times daily.    Dispense:  20 tablet    Refill:  0   benzonatate (TESSALON PERLES) 100 MG capsule    Sig: 1-2 capsules up to twice daily as needed for cough    Dispense:  30 capsule    Refill:  0     I hope you are feeling better soon!  Seek in person care promptly if your symptoms worsen, new concerns arise or you are not improving with treatment.  It was nice to meet you today. I help [facility] out with telemedicine visits on Tuesdays and Thursdays and am available for visits on those days. If you have any concerns or questions following this visit please schedule a follow up visit with your Primary Care doctor or seek care at a local urgent care clinic to avoid delays in care.

## 2021-04-23 ENCOUNTER — Other Ambulatory Visit: Payer: Self-pay | Admitting: Internal Medicine

## 2021-09-15 ENCOUNTER — Ambulatory Visit (INDEPENDENT_AMBULATORY_CARE_PROVIDER_SITE_OTHER): Payer: MEDICARE | Admitting: Internal Medicine

## 2021-09-15 ENCOUNTER — Encounter: Payer: Self-pay | Admitting: Internal Medicine

## 2021-09-15 VITALS — BP 122/76 | HR 76 | Temp 98.1°F | Ht 67.0 in

## 2021-09-15 DIAGNOSIS — E538 Deficiency of other specified B group vitamins: Secondary | ICD-10-CM

## 2021-09-15 DIAGNOSIS — E559 Vitamin D deficiency, unspecified: Secondary | ICD-10-CM | POA: Diagnosis not present

## 2021-09-15 DIAGNOSIS — F439 Reaction to severe stress, unspecified: Secondary | ICD-10-CM

## 2021-09-15 DIAGNOSIS — Z1211 Encounter for screening for malignant neoplasm of colon: Secondary | ICD-10-CM | POA: Diagnosis not present

## 2021-09-15 DIAGNOSIS — Z0000 Encounter for general adult medical examination without abnormal findings: Secondary | ICD-10-CM

## 2021-09-15 DIAGNOSIS — E2740 Unspecified adrenocortical insufficiency: Secondary | ICD-10-CM

## 2021-09-15 NOTE — Patient Instructions (Signed)
Blue-Emu cream -- use 2-3 times a day ? ?

## 2021-09-15 NOTE — Progress Notes (Addendum)
Subjective:  Patient ID: Jacinto Halim, female    DOB: 1955-08-26  Age: 66 y.o. MRN: [id number]  CC: No chief complaint on file.   HPI Roshana A Footman presents for a well exam C/o stress w/son  Follow-up on adrenal insufficiency, vitamin B 12 deficiency vitamin D deficiency  Outpatient Medications Prior to Visit  Medication Sig Dispense Refill   Cyanocobalamin (VITAMIN B-12) 1000 MCG SUBL Place 1 tablet (1,000 mcg total) under the tongue daily. 100 tablet 3   diphenhydrAMINE (BENADRYL) 25 mg capsule Take 25 mg by mouth every 6 (six) hours as needed for allergies.     famotidine (PEPCID) 40 MG tablet TAKE 1 TABLET BY MOUTH EVERY DAY 90 tablet 3   amoxicillin-clavulanate (AUGMENTIN) 875-125 MG tablet Take 1 tablet by mouth 2 (two) times daily. 20 tablet 0   benzonatate (TESSALON PERLES) 100 MG capsule 1-2 capsules up to twice daily as needed for cough 30 capsule 0   No facility-administered medications prior to visit.    ROS: Review of Systems  Constitutional:  Positive for unexpected weight change. Negative for activity change, appetite change, chills and fatigue.  HENT:  Negative for congestion, mouth sores and sinus pressure.   Eyes:  Negative for visual disturbance.  Respiratory:  Negative for cough and chest tightness.   Gastrointestinal:  Negative for abdominal pain and nausea.  Genitourinary:  Negative for difficulty urinating, frequency and vaginal pain.  Musculoskeletal:  Negative for back pain and gait problem.  Skin:  Negative for pallor and rash.  Neurological:  Negative for dizziness, tremors, weakness, numbness and headaches.  Psychiatric/Behavioral:  Negative for confusion and sleep disturbance.     Objective:  BP 122/76 (BP Location: Left Arm, Patient Position: Sitting, Cuff Size: Large)   Pulse 76   Temp 98.1 F (36.7 C) (Oral)   Ht '5\' 7"'$  (1.702 m)   SpO2 98%   BMI 28.25 kg/m   BP Readings from Last 3 Encounters:  09/15/21 122/76  07/22/20  132/90  04/22/20 (!) 142/84    Wt Readings from Last 3 Encounters:  09/14/18 180 lb 6.4 oz (81.8 kg)  10/07/06 144 lb (65.3 kg)    Physical Exam Constitutional:      General: She is not in acute distress.    Appearance: She is well-developed. She is obese.  HENT:     Head: Normocephalic.     Right Ear: External ear normal.     Left Ear: External ear normal.     Nose: Nose normal.  Eyes:     General:        Right eye: No discharge.        Left eye: No discharge.     Conjunctiva/sclera: Conjunctivae normal.     Pupils: Pupils are equal, round, and reactive to light.  Neck:     Thyroid: No thyromegaly.     Vascular: No JVD.     Trachea: No tracheal deviation.  Cardiovascular:     Rate and Rhythm: Normal rate and regular rhythm.     Heart sounds: Normal heart sounds.  Pulmonary:     Effort: No respiratory distress.     Breath sounds: No stridor. No wheezing.  Abdominal:     General: Bowel sounds are normal. There is no distension.     Palpations: Abdomen is soft. There is no mass.     Tenderness: There is no abdominal tenderness. There is no guarding or rebound.  Musculoskeletal:  General: No tenderness.     Cervical back: Normal range of motion and neck supple. No rigidity.  Lymphadenopathy:     Cervical: No cervical adenopathy.  Skin:    Findings: No erythema or rash.  Neurological:     Cranial Nerves: No cranial nerve deficit.     Motor: No abnormal muscle tone.     Coordination: Coordination normal.     Deep Tendon Reflexes: Reflexes normal.  Psychiatric:        Behavior: Behavior normal.        Thought Content: Thought content normal.        Judgment: Judgment normal.     Lab Results  Component Value Date   WBC 9.3 09/16/2021   HGB 14.3 09/16/2021   HCT 43.4 09/16/2021   PLT 277.0 09/16/2021   GLUCOSE 94 09/16/2021   CHOL 211 (H) 09/16/2021   TRIG 196.0 (H) 09/16/2021   HDL 56.60 09/16/2021   LDLCALC 115 (H) 09/16/2021   ALT 14 09/16/2021    AST 16 09/16/2021   NA 138 09/16/2021   K 3.9 09/16/2021   CL 103 09/16/2021   CREATININE 0.79 09/16/2021   BUN 17 09/16/2021   CO2 25 09/16/2021   TSH 1.81 09/16/2021   INR 0.96 07/03/2009   HGBA1C 5.7 07/20/2013    No results found.  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      B12 deficiency    Chronic Risks associated with treatment noncompliance were discussed. Compliance was encouraged.      Relevant Orders   Vitamin B12 (Completed)   Vitamin D deficiency - Primary    On vitamin D.      Relevant Orders   VITAMIN D 25 Hydroxy (Vit-D Deficiency, Fractures) (Completed)   Other Visit Diagnoses     Well adult exam       Relevant Orders   TSH (Completed)   Urinalysis (Completed)   CBC with Differential/Platelet (Completed)   Lipid panel (Completed)   Comprehensive metabolic panel (Completed)   Colon cancer screening       Relevant Orders   Cologuard         No orders of the defined types were placed in this encounter.     Follow-up: Return in about 1 year (around 09/16/2022) for Wellness Exam.  Walker Kehr, MD

## 2021-09-15 NOTE — Assessment & Plan Note (Addendum)
Chronic Risks associated with treatment noncompliance were discussed. Compliance was encouraged. Restart vitamin B12

## 2021-09-15 NOTE — Assessment & Plan Note (Addendum)
Potential benefits of a long term steroid  use as well as potential risks  and complications were explained to the patient and were aknowledged. Monitor symptoms

## 2021-09-16 ENCOUNTER — Encounter: Payer: Self-pay | Admitting: Internal Medicine

## 2021-09-16 ENCOUNTER — Other Ambulatory Visit (INDEPENDENT_AMBULATORY_CARE_PROVIDER_SITE_OTHER): Payer: MEDICARE

## 2021-09-16 DIAGNOSIS — E559 Vitamin D deficiency, unspecified: Secondary | ICD-10-CM

## 2021-09-16 DIAGNOSIS — Z136 Encounter for screening for cardiovascular disorders: Secondary | ICD-10-CM

## 2021-09-16 DIAGNOSIS — E538 Deficiency of other specified B group vitamins: Secondary | ICD-10-CM | POA: Diagnosis not present

## 2021-09-16 DIAGNOSIS — Z0000 Encounter for general adult medical examination without abnormal findings: Secondary | ICD-10-CM

## 2021-09-16 DIAGNOSIS — E2740 Unspecified adrenocortical insufficiency: Secondary | ICD-10-CM

## 2021-09-16 LAB — LIPID PANEL
Cholesterol: 211 mg/dL — ABNORMAL HIGH (ref 0–200)
HDL: 56.6 mg/dL (ref >39.00)
LDL Cholesterol: 115 mg/dL — ABNORMAL HIGH (ref 0–99)
NonHDL: 154.09
Total CHOL/HDL Ratio: 4
Triglycerides: 196 mg/dL — ABNORMAL HIGH (ref 0.0–149.0)
VLDL: 39.2 mg/dL (ref 0.0–40.0)

## 2021-09-16 LAB — CBC WITH DIFFERENTIAL/PLATELET
Basophils Absolute: 0.1 10*3/uL (ref 0.0–0.1)
Basophils Relative: 1 % (ref 0.0–3.0)
Eosinophils Absolute: 0.4 10*3/uL (ref 0.0–0.7)
Eosinophils Relative: 4 % (ref 0.0–5.0)
HCT: 43.4 % (ref 36.0–46.0)
Hemoglobin: 14.3 g/dL (ref 12.0–15.0)
Lymphocytes Relative: 26.8 % (ref 12.0–46.0)
Lymphs Abs: 2.5 10*3/uL (ref 0.7–4.0)
MCHC: 33 g/dL (ref 30.0–36.0)
MCV: 89.9 fl (ref 78.0–100.0)
Monocytes Absolute: 0.7 10*3/uL (ref 0.1–1.0)
Monocytes Relative: 7.6 % (ref 3.0–12.0)
Neutro Abs: 5.7 10*3/uL (ref 1.4–7.7)
Neutrophils Relative %: 60.6 % (ref 43.0–77.0)
Platelets: 277 10*3/uL (ref 150.0–400.0)
RBC: 4.83 Mil/uL (ref 3.87–5.11)
RDW: 12.7 % (ref 11.5–15.5)
WBC: 9.3 10*3/uL (ref 4.0–10.5)

## 2021-09-16 LAB — COMPREHENSIVE METABOLIC PANEL
ALT: 14 U/L (ref 0–35)
AST: 16 U/L (ref 0–37)
Albumin: 4.6 g/dL (ref 3.5–5.2)
Alkaline Phosphatase: 69 U/L (ref 39–117)
BUN: 17 mg/dL (ref 6–23)
CO2: 25 mEq/L (ref 19–32)
Calcium: 9.6 mg/dL (ref 8.4–10.5)
Chloride: 103 mEq/L (ref 96–112)
Creatinine, Ser: 0.79 mg/dL (ref 0.40–1.20)
GFR: 77.8 mL/min (ref >60.00)
Glucose, Bld: 94 mg/dL (ref 70–99)
Potassium: 3.9 mEq/L (ref 3.5–5.1)
Sodium: 138 mEq/L (ref 135–145)
Total Bilirubin: 0.4 mg/dL (ref 0.2–1.2)
Total Protein: 7.6 g/dL (ref 6.0–8.3)

## 2021-09-16 LAB — URINALYSIS
Bilirubin Urine: NEGATIVE
Hgb urine dipstick: NEGATIVE
Ketones, ur: NEGATIVE
Leukocytes,Ua: NEGATIVE
Nitrite: NEGATIVE
Specific Gravity, Urine: <=1.005 — AB (ref 1.000–1.030)
Total Protein, Urine: NEGATIVE
Urine Glucose: NEGATIVE
Urobilinogen, UA: 0.2 (ref 0.0–1.0)
pH: 6 (ref 5.0–8.0)

## 2021-09-16 LAB — VITAMIN B12: Vitamin B-12: 550 pg/mL (ref 211–911)

## 2021-09-16 LAB — VITAMIN D 25 HYDROXY (VIT D DEFICIENCY, FRACTURES): VITD: 24.66 ng/mL — ABNORMAL LOW (ref 30.00–100.00)

## 2021-09-16 LAB — TSH: TSH: 1.81 u[IU]/mL (ref 0.35–5.50)

## 2021-09-17 ENCOUNTER — Other Ambulatory Visit: Payer: Self-pay | Admitting: Internal Medicine

## 2021-09-17 MED ORDER — VITAMIN D3 50 MCG (2000 UT) PO CAPS: 2000.0000 [IU] | ORAL_CAPSULE | Freq: Every day | ORAL | 3 refills | Status: DC

## 2021-09-17 MED ORDER — VITAMIN D (ERGOCALCIFEROL) 1.25 MG (50000 UNIT) PO CAPS: 50000.0000 [IU] | ORAL_CAPSULE | ORAL | 0 refills | Status: DC

## 2021-09-28 ENCOUNTER — Telehealth: Payer: Self-pay

## 2021-09-28 MED ORDER — METHYLPREDNISOLONE 4 MG PO TBPK: ORAL_TABLET | ORAL | 0 refills | Status: DC

## 2021-09-28 NOTE — Telephone Encounter (Signed)
Pt is requesting a Rx for prison ivy. Pt states that its spreading and the itching is severe.  Pt wanted me to send the message but I advised her that an appt may be needed.  Pharmacy: [id number] [place]- [address]  LOV 09/15/21

## 2021-09-28 NOTE — Telephone Encounter (Signed)
OK. Will email Medrol pack to [facility] Thx

## 2021-09-29 NOTE — Telephone Encounter (Signed)
Notified pt MD sent rx to [place]../lm,b

## 2021-11-08 DIAGNOSIS — E559 Vitamin D deficiency, unspecified: Secondary | ICD-10-CM | POA: Insufficient documentation

## 2021-11-08 NOTE — Addendum Note (Signed)
Addended by: Cassandria Anger on: 11/08/2021 11:03 PM   Modules accepted: Level of Service

## 2021-11-08 NOTE — Assessment & Plan Note (Addendum)
Continue with vitamin D daily

## 2021-11-10 DIAGNOSIS — F439 Reaction to severe stress, unspecified: Secondary | ICD-10-CM | POA: Insufficient documentation

## 2021-11-10 NOTE — Assessment & Plan Note (Signed)
We discussed stress management options.

## 2021-12-16 ENCOUNTER — Ambulatory Visit (INDEPENDENT_AMBULATORY_CARE_PROVIDER_SITE_OTHER): Payer: MEDICARE

## 2021-12-16 VITALS — Ht 67.0 in

## 2021-12-16 DIAGNOSIS — Z0000 Encounter for general adult medical examination without abnormal findings: Secondary | ICD-10-CM | POA: Diagnosis not present

## 2021-12-16 DIAGNOSIS — Z1211 Encounter for screening for malignant neoplasm of colon: Secondary | ICD-10-CM | POA: Diagnosis not present

## 2021-12-16 NOTE — Patient Instructions (Addendum)
[name] , Thank you for taking time to come for your Medicare Wellness Visit. I appreciate your ongoing commitment to your health goals. Please review the following plan we discussed and let me know if I can assist you in the future.   These are the goals we discussed:  Goals      Client understands the importance of follow-up with providers by attending scheduled visits        This is a list of the screening recommended for you and due dates:  Health Maintenance  Topic Date Due   COVID-19 Vaccine (1) Never done   Hepatitis C Screening: USPSTF Recommendation to screen - Ages 41-79 yo.  Never done   Tetanus Vaccine  Never done   Zoster (Shingles) Vaccine (1 of 2) Never done   Colon Cancer Screening  Never done   Mammogram  Never done   Pneumonia Vaccine (1 - PCV) Never done   DEXA scan (bone density measurement)  Never done   Flu Shot  09/15/2021   Medicare Annual Wellness Visit  12/17/2022   HPV Vaccine  Aged Out    Advanced directives: Yes; Please bring a copy of your health care power of attorney and living will to the office at your convenience.  Conditions/risks identified: Yes  Next appointment: Follow up in one year for your annual wellness visit.   Preventive Care 44 Years and Older, Female Preventive care refers to lifestyle choices and visits with your health care provider that can promote health and wellness. What does preventive care include? A yearly physical exam. This is also called an annual well check. Dental exams once or twice a year. Routine eye exams. Ask your health care provider how often you should have your eyes checked. Personal lifestyle choices, including: Daily care of your teeth and gums. Regular physical activity. Eating a healthy diet. Avoiding tobacco and drug use. Limiting alcohol use. Practicing safe sex. Taking low-dose aspirin every day. Taking vitamin and mineral supplements as recommended by your health care provider. What  happens during an annual well check? The services and screenings done by your health care provider during your annual well check will depend on your age, overall health, lifestyle risk factors, and family history of disease. Counseling  Your health care provider may ask you questions about your: Alcohol use. Tobacco use. Drug use. Emotional well-being. Home and relationship well-being. Sexual activity. Eating habits. History of falls. Memory and ability to understand (cognition). Work and work Statistician. Reproductive health. Screening  You may have the following tests or measurements: Height, weight, and BMI. Blood pressure. Lipid and cholesterol levels. These may be checked every 5 years, or more frequently if you are over 65 years old. Skin check. Lung cancer screening. You may have this screening every year starting at age 3 if you have a 30-pack-year history of smoking and currently smoke or have quit within the past 15 years. Fecal occult blood test (FOBT) of the stool. You may have this test every year starting at age 8. Flexible sigmoidoscopy or colonoscopy. You may have a sigmoidoscopy every 5 years or a colonoscopy every 10 years starting at age 52. Hepatitis C blood test. Hepatitis B blood test. Sexually transmitted disease (STD) testing. Diabetes screening. This is done by checking your blood sugar (glucose) after you have not eaten for a while (fasting). You may have this done every 1-3 years. Bone density scan. This is done to screen for osteoporosis. You may have this done starting at  age 69. Mammogram. This may be done every 1-2 years. Talk to your health care provider about how often you should have regular mammograms. Talk with your health care provider about your test results, treatment options, and if necessary, the need for more tests. Vaccines  Your health care provider may recommend certain vaccines, such as: Influenza vaccine. This is recommended every  year. Tetanus, diphtheria, and acellular pertussis (Tdap, Td) vaccine. You may need a Td booster every 10 years. Zoster vaccine. You may need this after age 57. Pneumococcal 13-valent conjugate (PCV13) vaccine. One dose is recommended after age 33. Pneumococcal polysaccharide (PPSV23) vaccine. One dose is recommended after age 10. Talk to your health care provider about which screenings and vaccines you need and how often you need them. This information is not intended to replace advice given to you by your health care provider. Make sure you discuss any questions you have with your health care provider. Document Released: 02/28/2015 Document Revised: 10/22/2015 Document Reviewed: 12/03/2014 Elsevier Interactive Patient Education  2017 [place] Prevention in the Home Falls can cause injuries. They can happen to people of all ages. There are many things you can do to make your home safe and to help prevent falls. What can I do on the outside of my home? Regularly fix the edges of walkways and driveways and fix any cracks. Remove anything that might make you trip as you walk through a door, such as a raised step or threshold. Trim any bushes or trees on the path to your home. Use bright outdoor lighting. Clear any walking paths of anything that might make someone trip, such as rocks or tools. Regularly check to see if handrails are loose or broken. Make sure that both sides of any steps have handrails. Any raised decks and porches should have guardrails on the edges. Have any leaves, snow, or ice cleared regularly. Use sand or salt on walking paths during winter. Clean up any spills in your garage right away. This includes oil or grease spills. What can I do in the bathroom? Use night lights. Install grab bars by the toilet and in the tub and shower. Do not use towel bars as grab bars. Use non-skid mats or decals in the tub or shower. If you need to sit down in the shower, use a  plastic, non-slip stool. Keep the floor dry. Clean up any water that spills on the floor as soon as it happens. Remove soap buildup in the tub or shower regularly. Attach bath mats securely with double-sided non-slip rug tape. Do not have throw rugs and other things on the floor that can make you trip. What can I do in the bedroom? Use night lights. Make sure that you have a light by your bed that is easy to reach. Do not use any sheets or blankets that are too big for your bed. They should not hang down onto the floor. Have a firm chair that has side arms. You can use this for support while you get dressed. Do not have throw rugs and other things on the floor that can make you trip. What can I do in the kitchen? Clean up any spills right away. Avoid walking on wet floors. Keep items that you use a lot in easy-to-reach places. If you need to reach something above you, use a strong step stool that has a grab bar. Keep electrical cords out of the way. Do not use floor polish or wax that makes floors  slippery. If you must use wax, use non-skid floor wax. Do not have throw rugs and other things on the floor that can make you trip. What can I do with my stairs? Do not leave any items on the stairs. Make sure that there are handrails on both sides of the stairs and use them. Fix handrails that are broken or loose. Make sure that handrails are as long as the stairways. Check any carpeting to make sure that it is firmly attached to the stairs. Fix any carpet that is loose or worn. Avoid having throw rugs at the top or bottom of the stairs. If you do have throw rugs, attach them to the floor with carpet tape. Make sure that you have a light switch at the top of the stairs and the bottom of the stairs. If you do not have them, ask someone to add them for you. What else can I do to help prevent falls? Wear shoes that: Do not have high heels. Have rubber bottoms. Are comfortable and fit you  well. Are closed at the toe. Do not wear sandals. If you use a stepladder: Make sure that it is fully opened. Do not climb a closed stepladder. Make sure that both sides of the stepladder are locked into place. Ask someone to hold it for you, if possible. Clearly mark and make sure that you can see: Any grab bars or handrails. First and last steps. Where the edge of each step is. Use tools that help you move around (mobility aids) if they are needed. These include: Canes. Walkers. Scooters. Crutches. Turn on the lights when you go into a dark area. Replace any light bulbs as soon as they burn out. Set up your furniture so you have a clear path. Avoid moving your furniture around. If any of your floors are uneven, fix them. If there are any pets around you, be aware of where they are. Review your medicines with your doctor. Some medicines can make you feel dizzy. This can increase your chance of falling. Ask your doctor what other things that you can do to help prevent falls. This information is not intended to replace advice given to you by your health care provider. Make sure you discuss any questions you have with your health care provider. Document Released: 11/28/2008 Document Revised: 07/10/2015 Document Reviewed: 03/08/2014 Elsevier Interactive Patient Education  2017 [employer].

## 2021-12-16 NOTE — Progress Notes (Addendum)
Virtual Visit via Telephone Note  I connected with  [name] on 12/16/21 at  9:45 AM EDT by telephone and verified that I am speaking with the correct person using two identifiers.  Location: Patient: Home Provider: [place] Persons participating in the virtual visit: [place]   I discussed the limitations, risks, security and privacy concerns of performing an evaluation and management service by telephone and the availability of in person appointments. The patient expressed understanding and agreed to proceed.  Interactive audio and video telecommunications were attempted between this nurse and patient, however failed, due to patient having technical difficulties OR patient did not have access to video capability.  We continued and completed visit with audio only.  Some vital signs may be absent or patient reported.   Sheral Flow, LPN  Subjective:   [name] is a 66 y.o. female who presents for an Initial Medicare Annual Wellness Visit.  Review of Systems     Cardiac Risk Factors include: advanced age (>9mn, >>66women);family history of premature cardiovascular disease     Objective:    Today's Vitals   12/16/21 1005  Height: '5\' 7"'$  (1.702 m)  PainSc: 0-No pain   Body mass index is 28.25 kg/m.     12/16/2021    9:54 AM 09/14/2018    6:43 AM  Advanced Directives  Does Patient Have a Medical Advance Directive? Yes Yes  Type of AParamed[place]of [place] will  Copy of [place] Chart? No - copy requested No - copy requested    Current Medications (verified) Outpatient Encounter Medications as of 12/16/2021  Medication Sig   Cholecalciferol (VITAMIN D3) 50 MCG (2000 UT) capsule Take 1 capsule (2,000 Units total) by mouth daily.   Cyanocobalamin (VITAMIN B-12) 1000 MCG SUBL Place 1 tablet (1,000 mcg total) under the tongue  daily.   diphenhydrAMINE (BENADRYL) 25 mg capsule Take 25 mg by mouth every 6 (six) hours as needed for allergies.   famotidine (PEPCID) 40 MG tablet TAKE 1 TABLET BY MOUTH EVERY DAY   methylPREDNISolone (MEDROL DOSEPAK) 4 MG TBPK tablet As directed   Vitamin D, Ergocalciferol, (DRISDOL) 1.25 MG (50000 UNIT) CAPS capsule Take 1 capsule (50,000 Units total) by mouth every 7 (seven) days.   No facility-administered encounter medications on file as of 12/16/2021.    Allergies (verified) Butrans [buprenorphine hcl], Buprenorphine, Codeine, Doxycycline, Hydrocodone-acetaminophen, Other, and Oxycodone-acetaminophen   History: Past Medical History:  Diagnosis Date   Adrenal insufficiency ([place]    post-viral   Balance problem 203-26-2008  Likely central   GERD (gastroesophageal reflux disease)    Gout    Grief    Son died 226-Mar-2007  Hyponatremia 203/26/2008  Hypotension    Poss adrenal insuff. ? Viral encephalitis vm Lyme 203-26-08  Osteoarthritis    LEFT KNEE   PONV (postoperative nausea and vomiting)    Skin cancer 203-26-2011  Vitamin B 12 deficiency 22009-03-26  Borderline   Past Surgical History:  Procedure Laterality Date   ANTERIOR AND POSTERIOR REPAIR N/A 09/14/2018   Procedure: ANTERIOR (CYSTOCELE) AND POSTERIOR REPAIR (RECTOCELE);  Surgeon: LTyson Dense MD;  [facility]  Service: Gynecology;  Laterality: N/A;   CYSTOSCOPY N/A 09/14/2018   Procedure: CYSTOSCOPY;  Surgeon: LTyson Dense MD;  [facility]  Service: Gynecology;  Laterality: N/A;   GIANT CELL CYST REMOVAL  05/30/09   Left 2nd dorsal MCP   LAPAROTOMY Bilateral 09/14/2018   Procedure: OPEN LAPAROTOMY, LEFT SALPINGO-OOPHORECTOMY, RIGHT OOPHORECTOMY;  Surgeon: Tyson Dense, MD;  [facility];  Service: Gynecology;  Laterality: Bilateral;   LYSIS OF ADHESION N/A 09/14/2018   Procedure: LYSIS OF ADHESIONS;  Surgeon: Tyson Dense, MD;   [facility];  Service: Gynecology;  Laterality: N/A;   TARSAL TUNNEL RELEASE  2011   Right Dr Sharol Given   WRIST SURGERY     Right/ Dr Lucia Bitter 2010   Family History  Problem Relation Age of Onset   Diabetes Mother    Diabetes Father    Stroke Father    Heart disease Father    Diabetes Other        1st degree relative   Social History   Socioeconomic History   Marital status: Married    Spouse name: Not on file   Number of children: Not on file   Years of education: Not on file   Highest education level: Not on file  Occupational History   Occupation: [employer]  Tobacco Use   Smoking status: Never   Smokeless tobacco: Never  Substance and Sexual Activity   Alcohol use: No   Drug use: No   Sexual activity: Yes  Other Topics Concern   Not on file  Social History Narrative   Not on file   Social Determinants of Health   Financial Resource Strain: Low Risk  (12/16/2021)   Overall Financial Resource Strain (CARDIA)    Difficulty of Paying Living Expenses: Not hard at all  Food Insecurity: No Food Insecurity (12/16/2021)   Hunger Vital Sign    Worried About Running Out of Food in the Last Year: Never true    [place] in the Last Year: Never true  Transportation Needs: No Transportation Needs (12/16/2021)   PRAPARE - Hydrologist (Medical): No    Lack of Transportation (Non-Medical): No  Physical Activity: Sufficiently Active (12/16/2021)   Exercise Vital Sign    Days of Exercise per Week: 5 days    Minutes of Exercise per Session: 60 min  Stress: No Stress Concern Present (12/16/2021)   [place]    Feeling of Stress : Not at all  Social Connections: [place] (12/16/2021)   Social Connection and Isolation Panel [NHANES]    Frequency of Communication with Friends and Family: More than three times a week    Frequency of Social Gatherings  with Friends and Family: More than three times a week    Attends Religious Services: More than 4 times per year    Active Member of [employer] or Organizations: Yes    Attends Music therapist: More than 4 times per year    Marital Status: Married    Tobacco Counseling Counseling given: Not Answered   Clinical Intake:  Pre-visit preparation completed: Yes  Pain : No/denies pain Pain Score: 0-No pain     BMI - recorded: 28.25 (09/15/2021) Nutritional Status: BMI 25 -29 Overweight Nutritional Risks: None Diabetes: No  How often do you need to have someone help you when you read instructions, pamphlets, or other written materials from your doctor or pharmacy?: 1 - Never What is the last grade level you completed in school?: HSG  Diabetic? no  Interpreter Needed?: No  Information entered by :: Lisette Abu, LPN.   Activities  of Daily Living    12/16/2021    9:59 AM  In your present state of health, do you have any difficulty performing the following activities:  Hearing? 0  Vision? 0  Difficulty concentrating or making decisions? 0  Walking or climbing stairs? 0  Dressing or bathing? 0  Doing errands, shopping? 0  Preparing Food and eating ? N  Using the Toilet? N  In the past six months, have you accidently leaked urine? N  Do you have problems with loss of bowel control? N  Managing your Medications? N  Managing your Finances? N  Housekeeping or managing your Housekeeping? N    Patient Care Team: Plotnikov, Evie Lacks, MD as PCP - General (Internal Medicine)  Indicate any recent Medical Services you may have received from other than [facility] providers in the past year (date may be approximate).     Assessment:   This is a routine wellness examination for [place].  Hearing/Vision screen Vision Screening - Comments:: Cataracts removed; vision 20/20 - up to date with routine eye exams with Dr. Sharen Counter with [place] issues  and exercise activities discussed: Current Exercise Habits: Home exercise routine, Type of exercise: walking, Time (Minutes): 60, Frequency (Times/Week): 5, Weekly Exercise (Minutes/Week): 300, Intensity: Moderate, Exercise limited by: orthopedic condition(s)   Goals Addressed             This Visit's Progress    Client understands the importance of follow-up with providers by attending scheduled visits        Depression Screen    12/16/2021    9:55 AM 09/15/2021    4:10 PM 07/22/2020    3:55 PM 04/22/2020    3:49 PM  PHQ 2/9 Scores  PHQ - 2 Score 0 0 0 0  PHQ- 9 Score    0    Fall Risk    12/16/2021    9:55 AM 09/15/2021    4:10 PM 07/22/2020    3:55 PM 04/22/2020    3:49 PM  Fall Risk   Falls in the past year? 0 0 0 0  Number falls in past yr: 0 0 0 0  Injury with Fall? 0 0 0 0  Risk for fall due to : No Fall Risks No Fall Risks  No Fall Risks  Follow up Falls prevention discussed Falls evaluation completed      FALL RISK PREVENTION PERTAINING TO THE HOME:  Any stairs in or around the home? No  If so, are there any without handrails? No  Home free of loose throw rugs in walkways, pet beds, electrical cords, etc? Yes  Adequate lighting in your home to reduce risk of falls? Yes   ASSISTIVE DEVICES UTILIZED TO PREVENT FALLS:  Life alert? No  Use of a cane, walker or w/c? No  Grab bars in the bathroom? No  Shower chair or bench in shower? No  Elevated toilet seat or a handicapped toilet? No   TIMED UP AND GO:  Was the test performed? No . Phone Visit   Cognitive Function:        12/16/2021    9:55 AM  6CIT Screen  What Year? 0 points  What month? 0 points  What time? 0 points  Count back from 20 0 points  Months in reverse 0 points  Repeat phrase 0 points  Total Score 0 points    Immunizations Immunization History  Administered Date(s) Administered   Influenza Split 01/04/2012   Influenza Whole  01/11/2007, 11/14/2007, 01/13/2010   Influenza,inj,Quad  PF,6+ Mos 01/12/2013, 01/18/2014    TDAP status: Due, Education has been provided regarding the importance of this vaccine. Advised may receive this vaccine at local pharmacy or Health Dept. Aware to provide a copy of the vaccination record if obtained from local pharmacy or Health Dept. Verbalized acceptance and understanding.  Flu Vaccine status: Due, Education has been provided regarding the importance of this vaccine. Advised may receive this vaccine at local pharmacy or Health Dept. Aware to provide a copy of the vaccination record if obtained from local pharmacy or Health Dept. Verbalized acceptance and understanding.  Pneumococcal vaccine status: Declined,  Education has been provided regarding the importance of this vaccine but patient still declined. Advised may receive this vaccine at local pharmacy or Health Dept. Aware to provide a copy of the vaccination record if obtained from local pharmacy or Health Dept. Verbalized acceptance and understanding.   Covid-19 vaccine status: Declined, Education has been provided regarding the importance of this vaccine but patient still declined. Advised may receive this vaccine at local pharmacy or Health Dept.or vaccine clinic. Aware to provide a copy of the vaccination record if obtained from local pharmacy or Health Dept. Verbalized acceptance and understanding.  Qualifies for Shingles Vaccine? Yes   Zostavax completed No   Shingrix Completed?: No.    Education has been provided regarding the importance of this vaccine. Patient has been advised to call insurance company to determine out of pocket expense if they have not yet received this vaccine. Advised may also receive vaccine at local pharmacy or Health Dept. Verbalized acceptance and understanding.  Screening Tests Health Maintenance  Topic Date Due   COVID-19 Vaccine (1) Never done   Hepatitis C Screening  Never done   TETANUS/TDAP  Never done   Zoster Vaccines- Shingrix (1 of 2) Never  done   COLONOSCOPY (Pts 45-45yr Insurance coverage will need to be confirmed)  Never done   MAMMOGRAM  Never done   Pneumonia Vaccine 67 Years old (1 - PCV) Never done   DEXA SCAN  Never done   INFLUENZA VACCINE  09/15/2021   Medicare Annual Wellness (AWV)  12/17/2022   HPV VACCINES  Aged Out    Health Maintenance  Health Maintenance Due  Topic Date Due   COVID-19 Vaccine (1) Never done   Hepatitis C Screening  Never done   TETANUS/TDAP  Never done   Zoster Vaccines- Shingrix (1 of 2) Never done   COLONOSCOPY (Pts 45-481yrInsurance coverage will need to be confirmed)  Never done   MAMMOGRAM  Never done   Pneumonia Vaccine 6539Years old (1 - PCV) Never done   DEXA SCAN  Never done   INFLUENZA VACCINE  09/15/2021    Colorectal cancer screening: No longer required.  Patient declined colonoscopy.  Cologuard ordered.  Mammogram status: No longer required due to patient declined.  Bone Density status: Never done  Lung Cancer Screening: (Low Dose CT Chest recommended if Age 890-80ears, 30 pack-year currently smoking OR have quit w/in 15years.) does not qualify.   Lung Cancer Screening Referral: no  Additional Screening:  Hepatitis C Screening: does qualify; Completed no  Vision Screening: Recommended annual ophthalmology exams for early detection of glaucoma and other disorders of the eye. Is the patient up to date with their annual eye exam?  Yes  Who is the provider or what is the name of the office in which the patient attends annual eye exams? [facility]  If pt is not established with a provider, would they like to be referred to a provider to establish care? No .   Dental Screening: Recommended annual dental exams for proper oral hygiene  Community Resource Referral / Chronic Care Management: CRR required this visit?  No   CCM required this visit?  No      Plan:     I have personally reviewed and noted the following in the patient's chart:    Medical and social history Use of alcohol, tobacco or illicit drugs  Current medications and supplements including opioid prescriptions. Patient is not currently taking opioid prescriptions. Functional ability and status Nutritional status Physical activity Advanced directives List of other physicians Hospitalizations, surgeries, and ER visits in previous 12 months Vitals Screenings to include cognitive, depression, and falls Referrals and appointments  In addition, I have reviewed and discussed with patient certain preventive protocols, quality metrics, and best practice recommendations. A written personalized care plan for preventive services as well as general preventive health recommendations were provided to patient.     Sheral Flow, LPN   [id number]   Nurse Notes: N/A   Medical screening examination/treatment/procedure(s) were performed by non-physician practitioner and as supervising physician I was immediately available for consultation/collaboration.  I agree with above. Lew Dawes, MD

## 2021-12-31 LAB — COLOGUARD: COLOGUARD: NEGATIVE

## 2022-04-19 ENCOUNTER — Other Ambulatory Visit: Payer: Self-pay | Admitting: Internal Medicine

## 2022-05-12 DIAGNOSIS — M19042 Primary osteoarthritis, left hand: Secondary | ICD-10-CM | POA: Insufficient documentation

## 2022-09-21 ENCOUNTER — Encounter: Payer: Self-pay | Admitting: Internal Medicine

## 2022-09-21 ENCOUNTER — Ambulatory Visit (INDEPENDENT_AMBULATORY_CARE_PROVIDER_SITE_OTHER): Payer: MEDICARE | Admitting: Internal Medicine

## 2022-09-21 VITALS — BP 120/70 | HR 79 | Temp 98.2°F | Ht 67.0 in

## 2022-09-21 DIAGNOSIS — D485 Neoplasm of uncertain behavior of skin: Secondary | ICD-10-CM

## 2022-09-21 DIAGNOSIS — E785 Hyperlipidemia, unspecified: Secondary | ICD-10-CM

## 2022-09-21 DIAGNOSIS — R739 Hyperglycemia, unspecified: Secondary | ICD-10-CM

## 2022-09-21 DIAGNOSIS — E538 Deficiency of other specified B group vitamins: Secondary | ICD-10-CM

## 2022-09-21 DIAGNOSIS — E559 Vitamin D deficiency, unspecified: Secondary | ICD-10-CM

## 2022-09-21 MED ORDER — VITAMIN D (ERGOCALCIFEROL) 1.25 MG (50000 UNIT) PO CAPS: 50000.0000 [IU] | ORAL_CAPSULE | ORAL | 3 refills | Status: DC

## 2022-09-21 MED ORDER — VITAMIN B-12 1000 MCG PO TABS
1000.0000 ug | ORAL_TABLET | Freq: Every day | ORAL | 3 refills | Status: AC
Start: 2022-09-21 — End: ?

## 2022-09-21 MED ORDER — GABAPENTIN 300 MG PO CAPS: 300.0000 mg | ORAL_CAPSULE | Freq: Every day | ORAL | 3 refills | Status: DC

## 2022-09-21 NOTE — Progress Notes (Signed)
Subjective:  Patient ID: [name], female    DOB: [birth year]  Age: 67 y.o. MRN: [id number]  CC: Follow-up   HPI [name] presents for skin issues C/o stress C/o L foot pain - surgery pending in Sept 2024  Outpatient Medications Prior to Visit  Medication Sig Dispense Refill   famotidine (PEPCID) 40 MG tablet TAKE 1 TABLET BY MOUTH EVERY DAY 90 tablet 3   Cholecalciferol (VITAMIN D3) 50 MCG (2000 UT) capsule Take 1 capsule (2,000 Units total) by mouth daily. 100 capsule 3   Cyanocobalamin (VITAMIN B-12) 1000 MCG SUBL Place 1 tablet (1,000 mcg total) under the tongue daily. 100 tablet 3   diphenhydrAMINE (BENADRYL) 25 mg capsule Take 25 mg by mouth every 6 (six) hours as needed for allergies.     methylPREDNISolone (MEDROL DOSEPAK) 4 MG TBPK tablet As directed 21 tablet 0   Vitamin D, Ergocalciferol, (DRISDOL) 1.25 MG (50000 UNIT) CAPS capsule Take 1 capsule (50,000 Units total) by mouth every 7 (seven) days. 8 capsule 0   No facility-administered medications prior to visit.    ROS: Review of Systems  Constitutional:  Negative for activity change, appetite change, chills, fatigue and unexpected weight change.  HENT:  Negative for congestion, mouth sores and sinus pressure.   Eyes:  Negative for visual disturbance.  Respiratory:  Negative for cough and chest tightness.   Gastrointestinal:  Negative for abdominal pain and nausea.  Genitourinary:  Negative for difficulty urinating, frequency and vaginal pain.  Musculoskeletal:  Negative for back pain and gait problem.  Skin:  Positive for rash. Negative for pallor.  Neurological:  Negative for dizziness, tremors, weakness, numbness and headaches.  Psychiatric/Behavioral:  Negative for confusion, sleep disturbance and suicidal ideas. The patient is not nervous/anxious.     Objective:  BP 120/70 (BP Location: Right Arm, Patient Position: Sitting, Cuff Size: Large)   Pulse 79   Temp 98.2 F (36.8 C) (Oral)    Ht 5\' 7"  (1.702 m)   SpO2 99%   BMI 28.25 kg/m   BP Readings from Last 3 Encounters:  09/21/22 120/70  09/15/21 122/76  07/22/20 132/90    Wt Readings from Last 3 Encounters:  09/14/18 180 lb 6.4 oz (81.8 kg)  10/07/06 144 lb (65.3 kg)    Physical Exam Constitutional:      General: She is not in acute distress.    Appearance: She is well-developed. She is obese.  HENT:     Head: Normocephalic.     Right Ear: External ear normal.     Left Ear: External ear normal.     Nose: Nose normal.  Eyes:     General:        Right eye: No discharge.        Left eye: No discharge.     Conjunctiva/sclera: Conjunctivae normal.     Pupils: Pupils are equal, round, and reactive to light.  Neck:     Thyroid: No thyromegaly.     Vascular: No JVD.     Trachea: No tracheal deviation.  Cardiovascular:     Rate and Rhythm: Normal rate and regular rhythm.     Heart sounds: Normal heart sounds.  Pulmonary:     Effort: No respiratory distress.     Breath sounds: No stridor. No wheezing.  Abdominal:     General: Bowel sounds are normal. There is no distension.     Palpations: Abdomen is soft. There is no mass.     Tenderness:  There is no abdominal tenderness. There is no guarding or rebound.  Musculoskeletal:        General: No tenderness.     Cervical back: Normal range of motion and neck supple. No rigidity.     Right lower leg: No edema.     Left lower leg: No edema.  Lymphadenopathy:     Cervical: No cervical adenopathy.  Skin:    Findings: No erythema or rash.  Neurological:     Mental Status: She is oriented to person, place, and time.     Cranial Nerves: No cranial nerve deficit.     Motor: No abnormal muscle tone.     Coordination: Coordination normal.     Deep Tendon Reflexes: Reflexes normal.  Psychiatric:        Behavior: Behavior normal.        Thought Content: Thought content normal.        Judgment: Judgment normal.   SK-like lesion 7x6 mm on frontal scalp in the  hair partition line frontal 1/3  Lab Results  Component Value Date   WBC 9.3 09/16/2021   HGB 14.3 09/16/2021   HCT 43.4 09/16/2021   PLT 277.0 09/16/2021   GLUCOSE 94 09/16/2021   CHOL 211 (H) 09/16/2021   TRIG 196.0 (H) 09/16/2021   HDL 56.60 09/16/2021   LDLCALC 115 (H) 09/16/2021   ALT 14 09/16/2021   AST 16 09/16/2021   NA 138 09/16/2021   K 3.9 09/16/2021   CL 103 09/16/2021   CREATININE 0.79 09/16/2021   BUN 17 09/16/2021   CO2 25 09/16/2021   TSH 1.81 09/16/2021   INR 0.96 07/03/2009   HGBA1C 5.7 07/20/2013    No results found.  Assessment & Plan:   Problem List Items Addressed This Visit     RESOLVED: Neoplasm of uncertain behavior of skin - Primary    SK-like lesion 7x6 mm on frontal scalp in the hair partition line frontal 1/3 Bx was offered - declined      Relevant Orders   TSH   Urinalysis   CBC with Differential/Platelet   Lipid panel   Comprehensive metabolic panel   Vitamin B12   VITAMIN D 25 Hydroxy (Vit-D Deficiency, Fractures)   B12 deficiency    Re-start B12 Risks associated with treatment noncompliance were discussed. Compliance was encouraged.       Relevant Orders   TSH   Urinalysis   CBC with Differential/Platelet   Lipid panel   Comprehensive metabolic panel   Vitamin B12   VITAMIN D 25 Hydroxy (Vit-D Deficiency, Fractures)   Hyperglycemia   Vitamin D deficiency    Re-start Vit D Risks associated with treatment noncompliance were discussed. Compliance was encouraged.       Relevant Orders   TSH   Urinalysis   CBC with Differential/Platelet   Lipid panel   Comprehensive metabolic panel   Vitamin B12   VITAMIN D 25 Hydroxy (Vit-D Deficiency, Fractures)   Other Visit Diagnoses     Dyslipidemia       Relevant Orders   TSH   Lipid panel         Meds ordered this encounter  Medications   gabapentin (NEURONTIN) 300 MG capsule    Sig: Take 1 capsule (300 mg total) by mouth at bedtime.    Dispense:  90 capsule     Refill:  3   Vitamin D, Ergocalciferol, (DRISDOL) 1.25 MG (50000 UNIT) CAPS capsule    Sig: Take 1 capsule (50,000  Units total) by mouth every 30 (thirty) days.    Dispense:  3 capsule    Refill:  3   cyanocobalamin (VITAMIN B12) 1000 MCG tablet    Sig: Take 1 tablet (1,000 mcg total) by mouth daily.    Dispense:  100 tablet    Refill:  3      Follow-up: Return in about 3 months (around 12/22/2022) for a follow-up visit.  Sonda Primes, MD

## 2022-09-21 NOTE — Assessment & Plan Note (Signed)
Re-start Vit D Risks associated with treatment noncompliance were discussed. Compliance was encouraged.  

## 2022-09-21 NOTE — Assessment & Plan Note (Signed)
SK-like lesion 7x6 mm on frontal scalp in the hair partition line frontal 1/3 Bx was offered - declined

## 2022-12-22 ENCOUNTER — Ambulatory Visit: Payer: MEDICARE | Admitting: Internal Medicine

## 2022-12-22 ENCOUNTER — Encounter: Payer: Self-pay | Admitting: Internal Medicine

## 2022-12-22 VITALS — BP 120/80 | HR 72 | Temp 98.6°F | Ht 67.0 in

## 2022-12-22 DIAGNOSIS — K2970 Gastritis, unspecified, without bleeding: Secondary | ICD-10-CM | POA: Insufficient documentation

## 2022-12-22 DIAGNOSIS — E538 Deficiency of other specified B group vitamins: Secondary | ICD-10-CM

## 2022-12-22 DIAGNOSIS — R112 Nausea with vomiting, unspecified: Secondary | ICD-10-CM | POA: Diagnosis not present

## 2022-12-22 DIAGNOSIS — R197 Diarrhea, unspecified: Secondary | ICD-10-CM

## 2022-12-22 DIAGNOSIS — K2900 Acute gastritis without bleeding: Secondary | ICD-10-CM

## 2022-12-22 DIAGNOSIS — Z23 Encounter for immunization: Secondary | ICD-10-CM | POA: Diagnosis not present

## 2022-12-22 MED ORDER — PANTOPRAZOLE SODIUM 40 MG PO TBEC: 40.0000 mg | DELAYED_RELEASE_TABLET | Freq: Two times a day (BID) | ORAL | 1 refills | Status: DC

## 2022-12-22 MED ORDER — PROMETHAZINE HCL 12.5 MG PO TABS: 12.5000 mg | ORAL_TABLET | ORAL | 1 refills | Status: DC | PRN

## 2022-12-22 NOTE — Progress Notes (Signed)
Subjective:  Patient ID: [name], female    DOB: 30-Aug-1955  Age: 67 y.o. MRN: [id number]  CC: Medical Management of Chronic Issues (3 mnth f/u.Marland Kitchen Pt has eaten a bad portabella mushroom and believes she has had food poisoning with vomiting and being sick to her stomach x1 week.. Pt is wanting to have lab work to check for bacteria growth or something.)   HPI [name] presents for GI upset she has eaten a portabella mushroom and believes she has had food poisoning with vomiting and being sick to her stomach x1 week.  Outpatient Medications Prior to Visit  Medication Sig Dispense Refill   cyanocobalamin (VITAMIN B12) 1000 MCG tablet Take 1 tablet (1,000 mcg total) by mouth daily. 100 tablet 3   famotidine (PEPCID) 40 MG tablet TAKE 1 TABLET BY MOUTH EVERY DAY 90 tablet 3   gabapentin (NEURONTIN) 300 MG capsule Take 1 capsule (300 mg total) by mouth at bedtime. 90 capsule 3   methocarbamol (ROBAXIN) 750 MG tablet Take 750 mg by mouth every 6 (six) hours as needed.     oxyCODONE (OXY IR/ROXICODONE) 5 MG immediate release tablet Take 5 mg by mouth 4 (four) times daily as needed.     Vitamin D, Ergocalciferol, (DRISDOL) 1.25 MG (50000 UNIT) CAPS capsule Take 1 capsule (50,000 Units total) by mouth every 30 (thirty) days. 3 capsule 3   promethazine (PHENERGAN) 12.5 MG tablet Take 12.5 mg by mouth.     No facility-administered medications prior to visit.    ROS: Review of Systems  Constitutional:  Negative for activity change, appetite change, chills, fatigue and unexpected weight change.  HENT:  Negative for congestion, mouth sores and sinus pressure.   Eyes:  Negative for visual disturbance.  Respiratory:  Negative for cough and chest tightness.   Gastrointestinal:  Positive for nausea. Negative for abdominal pain.  Genitourinary:  Negative for difficulty urinating, frequency and vaginal pain.  Musculoskeletal:  Negative for back pain and gait problem.  Skin:  Negative  for pallor and rash.  Neurological:  Negative for dizziness, tremors, weakness, numbness and headaches.  Psychiatric/Behavioral:  Negative for confusion and sleep disturbance.     Objective:  BP 120/80 (BP Location: Right Arm, Patient Position: Sitting, Cuff Size: Normal)   Pulse 72   Temp 98.6 F (37 C) (Oral)   Ht 5\' 7"  (1.702 m)   SpO2 98%   BMI 28.25 kg/m   BP Readings from Last 3 Encounters:  12/22/22 120/80  09/21/22 120/70  09/15/21 122/76    Wt Readings from Last 3 Encounters:  09/14/18 180 lb 6.4 oz (81.8 kg)  10/07/06 144 lb (65.3 kg)    Physical Exam Constitutional:      General: She is not in acute distress.    Appearance: She is well-developed.  HENT:     Head: Normocephalic.     Right Ear: External ear normal.     Left Ear: External ear normal.     Nose: Nose normal.  Eyes:     General:        Right eye: No discharge.        Left eye: No discharge.     Conjunctiva/sclera: Conjunctivae normal.     Pupils: Pupils are equal, round, and reactive to light.  Neck:     Thyroid: No thyromegaly.     Vascular: No JVD.     Trachea: No tracheal deviation.  Cardiovascular:     Rate and Rhythm: Normal rate  and regular rhythm.     Heart sounds: Normal heart sounds.  Pulmonary:     Effort: No respiratory distress.     Breath sounds: No stridor. No wheezing.  Abdominal:     General: Bowel sounds are normal. There is no distension.     Palpations: Abdomen is soft. There is no mass.     Tenderness: There is no abdominal tenderness. There is no guarding or rebound.  Musculoskeletal:        General: No tenderness.     Cervical back: Normal range of motion and neck supple. No rigidity.  Lymphadenopathy:     Cervical: No cervical adenopathy.  Skin:    Findings: No erythema or rash.  Neurological:     Cranial Nerves: No cranial nerve deficit.     Motor: No abnormal muscle tone.     Coordination: Coordination normal.     Deep Tendon Reflexes: Reflexes normal.   Psychiatric:        Behavior: Behavior normal.        Thought Content: Thought content normal.        Judgment: Judgment normal.     Lab Results  Component Value Date   WBC 8.2 09/21/2022   HGB 13.5 09/21/2022   HCT 40.6 09/21/2022   PLT 253.0 09/21/2022   GLUCOSE 99 09/21/2022   CHOL 210 (H) 09/21/2022   TRIG 202.0 (H) 09/21/2022   HDL 70.00 09/21/2022   LDLDIRECT 129.0 09/21/2022   LDLCALC 115 (H) 09/16/2021   ALT 16 09/21/2022   AST 19 09/21/2022   NA 139 09/21/2022   K 3.8 09/21/2022   CL 105 09/21/2022   CREATININE 0.76 09/21/2022   BUN 11 09/21/2022   CO2 25 09/21/2022   TSH 1.52 09/21/2022   INR 0.96 07/03/2009   HGBA1C 5.7 07/20/2013    No results found.  Assessment & Plan:   Problem List Items Addressed This Visit     B12 deficiency    Chronic Risks associated with treatment noncompliance were discussed. Compliance was encouraged.      Nausea vomiting and diarrhea    New.  Due to gastroenteritis.  Given promethazine and pantoprazole. Lomotil as needed      Gastritis    Start pantoprazole      Other Visit Diagnoses     Need for influenza vaccination    -  Primary   Relevant Orders   Flu Vaccine Trivalent High Dose (Fluad) (Completed)         Meds ordered this encounter  Medications   promethazine (PHENERGAN) 12.5 MG tablet    Sig: Take 1 tablet (12.5 mg total) by mouth every 4 (four) hours as needed for nausea or vomiting.    Dispense:  30 tablet    Refill:  1   pantoprazole (PROTONIX) 40 MG tablet    Sig: Take 1 tablet (40 mg total) by mouth 2 (two) times daily.    Dispense:  60 tablet    Refill:  1      Follow-up: Return in about 6 months (around 06/21/2023) for a follow-up visit.  Sonda Primes, MD

## 2022-12-22 NOTE — Assessment & Plan Note (Signed)
Chronic Risks associated with treatment noncompliance were discussed. Compliance was encouraged. 

## 2023-01-13 ENCOUNTER — Other Ambulatory Visit: Payer: Self-pay | Admitting: Internal Medicine

## 2023-01-16 ENCOUNTER — Encounter: Payer: Self-pay | Admitting: Internal Medicine

## 2023-01-16 NOTE — Assessment & Plan Note (Signed)
Start pantoprazole 

## 2023-01-16 NOTE — Assessment & Plan Note (Signed)
New.  Due to gastroenteritis.  Given promethazine and pantoprazole. Lomotil as needed

## 2023-03-17 ENCOUNTER — Other Ambulatory Visit: Payer: Self-pay | Admitting: Medical Genetics

## 2023-04-01 ENCOUNTER — Other Ambulatory Visit (HOSPITAL_COMMUNITY)
Admission: RE | Admit: 2023-04-01 | Discharge: 2023-04-01 | Disposition: A | Discharge status: Home / Self Care | Payer: MEDICARE | Source: Ambulatory Visit | Attending: Medical Genetics | Admitting: Medical Genetics

## 2023-04-11 ENCOUNTER — Other Ambulatory Visit (HOSPITAL_COMMUNITY): Payer: Self-pay

## 2023-04-13 ENCOUNTER — Other Ambulatory Visit: Payer: Self-pay | Admitting: Internal Medicine

## 2023-04-13 LAB — GENECONNECT MOLECULAR SCREEN: Genetic Analysis Overall Interpretation: NEGATIVE

## 2023-05-03 ENCOUNTER — Ambulatory Visit: Payer: MEDICARE

## 2023-05-03 VITALS — Ht 67.0 in | Wt 170.0 lb

## 2023-05-03 DIAGNOSIS — Z780 Asymptomatic menopausal state: Secondary | ICD-10-CM

## 2023-05-03 DIAGNOSIS — Z0000 Encounter for general adult medical examination without abnormal findings: Secondary | ICD-10-CM | POA: Diagnosis not present

## 2023-05-03 NOTE — Progress Notes (Cosign Needed Addendum)
 Subjective:   [name] is a 68 y.o. who presents for a Medicare Wellness preventive visit.  Visit Complete: Virtual I connected with  Dan A Ebarb on 05/03/23 by a video and audio enabled telemedicine application and verified that I am speaking with the correct person using two identifiers.  Patient Location: Home  Provider Location: Home Office  I discussed the limitations of evaluation and management by telemedicine. The patient expressed understanding and agreed to proceed.  Vital Signs: Because this visit was a virtual/telehealth visit, some criteria may be missing or patient reported. Any vitals not documented were not able to be obtained and vitals that have been documented are patient reported.   Persons Participating in Visit: Patient.  AWV Questionnaire: Yes: Patient Medicare AWV questionnaire was completed by the patient on 05/02/2023; I have confirmed that all information answered by patient is correct and no changes since this date.  Cardiac Risk Factors include: advanced age (>32men, >79 women)     Objective:    Today's Vitals   05/03/23 1056  Weight: 170 lb (77.1 kg)  Height: 5\' 7"  (1.702 m)   Body mass index is 26.63 kg/m.     05/03/2023   11:05 AM 12/16/2021    9:54 AM 09/14/2018    6:43 AM  Advanced Directives  Does Patient Have a Medical Advance Directive? Yes Yes Yes  Type of Estate agent of [employer] Power of [place];Living will Healthcare Power of [place];Living will  Copy of Healthcare Power of Attorney in Chart? No - copy requested No - copy requested No - copy requested    Current Medications (verified) Outpatient Encounter Medications as of 05/03/2023  Medication Sig   cyanocobalamin (VITAMIN B12) 1000 MCG tablet Take 1 tablet (1,000 mcg total) by mouth daily.   famotidine (PEPCID) 40 MG tablet TAKE 1 TABLET BY MOUTH EVERY DAY   gabapentin (NEURONTIN) 300 MG capsule Take 1 capsule (300 mg total)  by mouth at bedtime.   methocarbamol (ROBAXIN) 750 MG tablet Take 750 mg by mouth every 6 (six) hours as needed.   oxyCODONE (OXY IR/ROXICODONE) 5 MG immediate release tablet Take 5 mg by mouth 4 (four) times daily as needed.   pantoprazole (PROTONIX) 40 MG tablet TAKE 1 TABLET BY MOUTH TWICE A DAY   promethazine (PHENERGAN) 12.5 MG tablet Take 1 tablet (12.5 mg total) by mouth every 4 (four) hours as needed for nausea or vomiting.   Vitamin D, Ergocalciferol, (DRISDOL) 1.25 MG (50000 UNIT) CAPS capsule Take 1 capsule (50,000 Units total) by mouth every 30 (thirty) days.   No facility-administered encounter medications on file as of 05/03/2023.    Allergies (verified) Butrans [buprenorphine hcl], Acetaminophen, Buprenorphine, Codeine, Doxycycline, Hydrocodone-acetaminophen, Other, and Oxycodone-acetaminophen   History: Past Medical History:  Diagnosis Date   Adrenal insufficiency (HCC)    post-viral   Balance problem 06/03/2006   Likely central   GERD (gastroesophageal reflux disease)    Gout    Grief    Son died 02-Jun-2005   Hyponatremia 03-Jun-2006   Hypotension    Poss adrenal insuff. ? Viral encephalitis vm Lyme 2006/06/03   Osteoarthritis    LEFT KNEE   PONV (postoperative nausea and vomiting)    Skin cancer 2009-06-02   Vitamin B 12 deficiency 2007/06/03   Borderline   Past Surgical History:  Procedure Laterality Date   ANTERIOR AND POSTERIOR REPAIR N/A 09/14/2018   Procedure: ANTERIOR (CYSTOCELE) AND POSTERIOR REPAIR (RECTOCELE);  Surgeon: Ranae Pila, MD;  Location: [facility];  Service: Gynecology;  Laterality: N/A;   CYSTOSCOPY N/A 09/14/2018   Procedure: CYSTOSCOPY;  Surgeon: Ranae Pila, MD;  Location: [facility];  Service: Gynecology;  Laterality: N/A;   GIANT CELL CYST REMOVAL  05/30/09   Left 2nd dorsal MCP   LAPAROTOMY Bilateral 09/14/2018   Procedure: OPEN LAPAROTOMY, LEFT SALPINGO-OOPHORECTOMY, RIGHT OOPHORECTOMY;  Surgeon: Ranae Pila,  MD;  Location[facility]  Service: Gynecology;  Laterality: Bilateral;   LYSIS OF ADHESION N/A 09/14/2018   Procedure: LYSIS OF ADHESIONS;  Surgeon: Ranae Pila, MD;  Location: [facility];  Service: Gynecology;  Laterality: N/A;   TARSAL TUNNEL RELEASE  2011   Right Dr Lajoyce Corners   WRIST SURGERY     Right/ Dr Caprice Red 2010   Family History  Problem Relation Age of Onset   Diabetes Mother    Diabetes Father    Stroke Father    Heart disease Father    Diabetes Other        1st degree relative   Social History   Socioeconomic History   Marital status: Married    Spouse name: Tom   Number of children: Not on file   Years of education: Not on file   Highest education level: Associate degree: occupational, Scientist, product/process development, or vocational program  Occupational History   Occupation: Tech [employer]   Occupation: RETIRED  Tobacco Use   Smoking status: Never   Smokeless tobacco: Never  Vaping Use   Vaping status: Former  Substance and Sexual Activity   Alcohol use: No   Drug use: No   Sexual activity: Yes  Other Topics Concern   Not on file  Social History Narrative   Lives at home with her husband and 1 son   Social Drivers of Corporate investment banker Strain: Low Risk  (05/02/2023)   Overall Financial Resource Strain (CARDIA)    Difficulty of Paying Living Expenses: Not hard at all  Food Insecurity: No Food Insecurity (05/02/2023)   Hunger Vital Sign    Worried About Running Out of Food in the Last Year: Never true    Ran Out of Food in the Last Year: Never true  Transportation Needs: No Transportation Needs (05/02/2023)   PRAPARE - Administrator, Civil Service (Medical): No    Lack of Transportation (Non-Medical): No  Physical Activity: Sufficiently Active (05/02/2023)   Exercise Vital Sign    Days of Exercise per Week: 7 days    Minutes of Exercise per Session: 60 min  Stress: No Stress Concern Present (05/02/2023)   [employer] of Occupational Health - Occupational Stress Questionnaire    Feeling of Stress : Not at all  Social Connections: Moderately Integrated (05/02/2023)   Social Connection and Isolation Panel [NHANES]    Frequency of Communication with Friends and Family: More than three times a week    Frequency of Social Gatherings with Friends and Family: More than three times a week    Attends Religious Services: More than 4 times per year    Active Member of [employer] or Organizations: No    Attends Engineer, structural: Patient declined    Marital Status: Married    Tobacco Counseling Counseling given: Not Answered    Clinical Intake:  Pre-visit preparation completed: Yes  Pain : No/denies pain     BMI - recorded: 26.63 Nutritional Status: BMI 25 -29 Overweight Nutritional Risks: None  How  often do you need to have someone help you when you read instructions, pamphlets, or other written materials from your doctor or pharmacy?: 1 - Never  Interpreter Needed?: No  Information entered by :: Kalid Ghan, RMA   Activities of Daily Living     05/03/2023   11:01 AM  In your present state of health, do you have any difficulty performing the following activities:  Hearing? 0  Vision? 0  Difficulty concentrating or making decisions? 0  Walking or climbing stairs? 0  Dressing or bathing? 0  Doing errands, shopping? 0  Preparing Food and eating ? N  Using the Toilet? N  In the past six months, have you accidently leaked urine? N  Do you have problems with loss of bowel control? N  Managing your Medications? N  Managing your Finances? N  Housekeeping or managing your Housekeeping? N    Patient Care Team: Plotnikov, Georgina Quint, MD as PCP - General (Internal Medicine) Delmonte, Wylie Hail, MD as Consulting Physician (Ophthalmology)  Indicate any recent Medical Services you may have received from other than [facility] providers in the past year (date may be approximate).      Assessment:   This is a routine wellness examination for Bent.  Hearing/Vision screen Hearing Screening - Comments:: Denies hearing difficulties   Vision Screening - Comments:: Denies vision issues.    Goals Addressed   None    Depression Screen     05/03/2023   11:06 AM 12/22/2022   11:02 AM 12/16/2021    9:55 AM 09/15/2021    4:10 PM 07/22/2020    3:55 PM 04/22/2020    3:49 PM  PHQ 2/9 Scores  PHQ - 2 Score 3 0 0 0 0 0  PHQ- 9 Score 3 0    0    Fall Risk     05/03/2023   11:05 AM 12/22/2022   11:02 AM 12/16/2021    9:55 AM 09/15/2021    4:10 PM 07/22/2020    3:55 PM  Fall Risk   Falls in the past year? 0 0 0 0 0  Number falls in past yr: 0 0 0 0 0  Injury with Fall? 0 0 0 0 0  Risk for fall due to : No Fall Risks No Fall Risks;Other (Comment) No Fall Risks No Fall Risks   Follow up Falls prevention discussed;Falls evaluation completed Falls evaluation completed Falls prevention discussed Falls evaluation completed     MEDICARE RISK AT HOME:  Medicare Risk at Home Any stairs in or around the home?: No Home free of loose throw rugs in walkways, pet beds, electrical cords, etc?: Yes Adequate lighting in your home to reduce risk of falls?: Yes Life alert?: No Use of a cane, walker or w/c?: No Grab bars in the bathroom?: No Shower chair or bench in shower?: Yes Elevated toilet seat or a handicapped toilet?: No  TIMED UP AND GO:  Was the test performed?  No  Cognitive Function: Normal: Normal cognitive status assessed by direct observation by this Clinical Health Advisor. No abnormalities found. Patient is able to answer questions in an accurate and timely manner.        12/16/2021    9:55 AM  6CIT Screen  What Year? 0 points  What month? 0 points  What time? 0 points  Count back from 20 0 points  Months in reverse 0 points  Repeat phrase 0 points  Total Score 0 points    Immunizations Immunization History  Administered Date(s) Administered   Fluad  Trivalent(High Dose 65+) 12/22/2022   Influenza Split 01/04/2012   Influenza Whole 01/11/2007, 11/14/2007, 01/13/2010   Influenza,inj,Quad PF,6+ Mos 01/12/2013, 01/18/2014    Screening Tests Health Maintenance  Topic Date Due   COVID-19 Vaccine (1) Never done   Hepatitis C Screening  Never done   DTaP/Tdap/Td (1 - Tdap) Never done   Zoster Vaccines- Shingrix (1 of 2) Never done   Colonoscopy  Never done   MAMMOGRAM  Never done   Pneumonia Vaccine 39+ Years old (1 of 1 - PCV) Never done   DEXA SCAN  Never done   Medicare Annual Wellness (AWV)  12/17/2022   INFLUENZA VACCINE  Completed   HPV VACCINES  Aged Out    Health Maintenance  Health Maintenance Due  Topic Date Due   COVID-19 Vaccine (1) Never done   Hepatitis C Screening  Never done   DTaP/Tdap/Td (1 - Tdap) Never done   Zoster Vaccines- Shingrix (1 of 2) Never done   Colonoscopy  Never done   MAMMOGRAM  Never done   Pneumonia Vaccine 66+ Years old (1 of 1 - PCV) Never done   DEXA SCAN  Never done   Medicare Annual Wellness (AWV)  12/17/2022   Health Maintenance Items Addressed: DEXA ordered, See Nurse Notes  Additional Screening:  Vision Screening: Recommended annual ophthalmology exams for early detection of glaucoma and other disorders of the eye.  Dental Screening: Recommended annual dental exams for proper oral hygiene  Community Resource Referral / Chronic Care Management: CRR required this visit?  No   CCM required this visit?  No     Plan:     I have personally reviewed and noted the following in the patient's chart:   Medical and social history Use of alcohol, tobacco or illicit drugs  Current medications and supplements including opioid prescriptions. Patient is not currently taking opioid prescriptions. Functional ability and status Nutritional status Physical activity Advanced directives List of other physicians Hospitalizations, surgeries, and ER visits in previous 12  months Vitals Screenings to include cognitive, depression, and falls Referrals and appointments  In addition, I have reviewed and discussed with patient certain preventive protocols, quality metrics, and best practice recommendations. A written personalized care plan for preventive services as well as general preventive health recommendations were provided to patient.     Yalitza Teed L Kalin Amrhein, CMA   05/03/2023   After Visit Summary: (MyChart) Due to this being a telephonic visit, the after visit summary with patients personalized plan was offered to patient via MyChart   Notes: Please refer to Routing Comments.  Medical screening examination/treatment/procedure(s) were performed by non-physician practitioner and as supervising physician I was immediately available for consultation/collaboration.  I agree with above. Jacinta Shoe, MD

## 2023-05-03 NOTE — Patient Instructions (Signed)
 [name] , Thank you for taking time to come for your Medicare Wellness Visit. I appreciate your ongoing commitment to your health goals. Please review the following plan we discussed and let me know if I can assist you in the future.   Referrals/Orders/Follow-Ups/Clinician Recommendations: It was nice talking to you today.  You are due for a Tetanus vaccine, a Shingles vaccine and a Pneumonia vaccine, which all, you can get at any local pharmacy.  You have an order for:  [x]   Bone Density     Please call for appointment:  [facility] [address] [place], [place] [address] [phone]  Make sure to wear two-piece clothing.  No lotions, powders, or deodorants the day of the appointment. Make sure to bring picture ID and insurance card.  Bring list of medications you are currently taking including any supplements.    This is a list of the screening recommended for you and due dates:  Health Maintenance  Topic Date Due   COVID-19 Vaccine (1) Never done   Hepatitis C Screening  Never done   DTaP/Tdap/Td vaccine (1 - Tdap) Never done   Zoster (Shingles) Vaccine (1 of 2) Never done   Colon Cancer Screening  Never done   Mammogram  Never done   Pneumonia Vaccine (1 of 1 - PCV) Never done   DEXA scan (bone density measurement)  Never done   Medicare Annual Wellness Visit  12/17/2022   Flu Shot  Completed   HPV Vaccine  Aged Out    Advanced directives: (Copy Requested) Please bring a copy of your health care power of attorney and living will to the office to be added to your chart at your convenience. You can mail to [facility] [address] [place], [place] [address] or email to [email or web address]  Next Medicare Annual Wellness Visit scheduled for next year: Yes

## 2023-05-09 ENCOUNTER — Ambulatory Visit
Admission: RE | Admit: 2023-05-09 | Discharge: 2023-05-09 | Disposition: A | Discharge status: Home / Self Care | Payer: MEDICARE | Source: Ambulatory Visit | Attending: Internal Medicine | Admitting: Internal Medicine

## 2023-05-09 DIAGNOSIS — Z780 Asymptomatic menopausal state: Secondary | ICD-10-CM

## 2023-06-21 ENCOUNTER — Ambulatory Visit (INDEPENDENT_AMBULATORY_CARE_PROVIDER_SITE_OTHER): Payer: MEDICARE | Admitting: Internal Medicine

## 2023-06-21 ENCOUNTER — Encounter: Payer: Self-pay | Admitting: Internal Medicine

## 2023-06-21 VITALS — BP 122/88 | HR 72 | Temp 97.7°F | Ht 67.0 in

## 2023-06-21 DIAGNOSIS — M65331 Trigger finger, right middle finger: Secondary | ICD-10-CM

## 2023-06-21 DIAGNOSIS — E559 Vitamin D deficiency, unspecified: Secondary | ICD-10-CM

## 2023-06-21 DIAGNOSIS — E538 Deficiency of other specified B group vitamins: Secondary | ICD-10-CM | POA: Diagnosis not present

## 2023-06-21 DIAGNOSIS — M1A9XX0 Chronic gout, unspecified, without tophus (tophi): Secondary | ICD-10-CM | POA: Diagnosis not present

## 2023-06-21 DIAGNOSIS — M6530 Trigger finger, unspecified finger: Secondary | ICD-10-CM | POA: Insufficient documentation

## 2023-06-21 NOTE — Assessment & Plan Note (Signed)
 R middle finger is triggering Splinting discussed Ortho ref

## 2023-06-21 NOTE — Assessment & Plan Note (Signed)
Re-start Vit D Risks associated with treatment noncompliance were discussed. Compliance was encouraged.  

## 2023-06-21 NOTE — Progress Notes (Signed)
 Subjective:  Patient ID: [name], female    DOB: 1955/12/31  Age: 68 y.o. MRN: [id number]  CC: Medical Management of Chronic Issues (6 Month follow up. Patient notes of worsening arthritis pain (right hand, left knee). )   HPI [name] presents for R middle finger is triggering F/u HTN, GERD, B12 def  Outpatient Medications Prior to Visit  Medication Sig Dispense Refill   cyanocobalamin  (VITAMIN B12) 1000 MCG tablet Take 1 tablet (1,000 mcg total) by mouth daily. 100 tablet 3   famotidine  (PEPCID ) 40 MG tablet TAKE 1 TABLET BY MOUTH EVERY DAY 90 tablet 3   gabapentin  (NEURONTIN ) 300 MG capsule Take 1 capsule (300 mg total) by mouth at bedtime. 90 capsule 3   methocarbamol (ROBAXIN) 750 MG tablet Take 750 mg by mouth every 6 (six) hours as needed.     oxyCODONE  (OXY IR/ROXICODONE ) 5 MG immediate release tablet Take 5 mg by mouth 4 (four) times daily as needed.     pantoprazole  (PROTONIX ) 40 MG tablet TAKE 1 TABLET BY MOUTH TWICE [name] DAY 180 tablet 3   promethazine  (PHENERGAN ) 12.5 MG tablet Take 1 tablet (12.5 mg total) by mouth every 4 (four) hours as needed for nausea or vomiting. 30 tablet 1   Vitamin D , Ergocalciferol , (DRISDOL ) 1.25 MG (50000 UNIT) CAPS capsule Take 1 capsule (50,000 Units total) by mouth every 30 (thirty) days. 3 capsule 3   No facility-administered medications prior to visit.    ROS: Review of Systems  Constitutional:  Negative for activity change, appetite change, chills, fatigue and unexpected weight change.  HENT:  Negative for congestion, mouth sores and sinus pressure.   Eyes:  Negative for visual disturbance.  Respiratory:  Negative for cough and chest tightness.   Gastrointestinal:  Negative for abdominal pain and nausea.  Genitourinary:  Negative for difficulty urinating, frequency and vaginal pain.  Musculoskeletal:  Positive for arthralgias. Negative for gait problem.  Skin:  Negative for pallor and rash.  Neurological:   Negative for dizziness, tremors, weakness, numbness and headaches.  Psychiatric/Behavioral:  Negative for confusion, sleep disturbance and suicidal ideas.     Objective:  BP 122/88   Pulse 72   Temp 97.7 F (36.5 C)   Ht 5\' 7"  (1.702 m)   SpO2 99%   BMI 26.63 kg/m   BP Readings from Last 3 Encounters:  06/21/23 122/88  12/22/22 120/80  09/21/22 120/70    Wt Readings from Last 3 Encounters:  05/03/23 170 lb (77.1 kg)  09/14/18 180 lb 6.4 oz (81.8 kg)  10/07/06 144 lb (65.3 kg)    Physical Exam Constitutional:      General: She is not in acute distress.    Appearance: She is well-developed. She is obese.  HENT:     Head: Normocephalic.     Right Ear: External ear normal.     Left Ear: External ear normal.     Nose: Nose normal.  Eyes:     General:        Right eye: No discharge.        Left eye: No discharge.     Conjunctiva/sclera: Conjunctivae normal.     Pupils: Pupils are equal, round, and reactive to light.  Neck:     Thyroid : No thyromegaly.     Vascular: No JVD.     Trachea: No tracheal deviation.  Cardiovascular:     Rate and Rhythm: Normal rate and regular rhythm.     Heart sounds: Normal  heart sounds.  Pulmonary:     Effort: No respiratory distress.     Breath sounds: No stridor. No wheezing.  Abdominal:     General: Bowel sounds are normal. There is no distension.     Palpations: Abdomen is soft. There is no mass.     Tenderness: There is no abdominal tenderness. There is no guarding or rebound.  Musculoskeletal:        General: Tenderness present.     Cervical back: Normal range of motion and neck supple. No rigidity.     Right lower leg: No edema.     Left lower leg: No edema.  Lymphadenopathy:     Cervical: No cervical adenopathy.  Skin:    Findings: No erythema or rash.  Neurological:     Mental Status: She is oriented to person, place, and time.     Cranial Nerves: No cranial nerve deficit.     Motor: No abnormal muscle tone.      Coordination: Coordination normal.     Deep Tendon Reflexes: Reflexes normal.  Psychiatric:        Behavior: Behavior normal.        Thought Content: Thought content normal.        Judgment: Judgment normal.   R middle finger is triggering   Lab Results  Component Value Date   WBC 8.2 09/21/2022   HGB 13.5 09/21/2022   HCT 40.6 09/21/2022   PLT 253.0 09/21/2022   GLUCOSE 99 09/21/2022   CHOL 210 (H) 09/21/2022   TRIG 202.0 (H) 09/21/2022   HDL 70.00 09/21/2022   LDLDIRECT 129.0 09/21/2022   LDLCALC 115 (H) 09/16/2021   ALT 16 09/21/2022   AST 19 09/21/2022   NA 139 09/21/2022   K 3.8 09/21/2022   CL 105 09/21/2022   CREATININE 0.76 09/21/2022   BUN 11 09/21/2022   CO2 25 09/21/2022   TSH 1.52 09/21/2022   INR 0.96 07/03/2009   HGBA1C 5.7 07/20/2013    DG Bone Density Result Date: 05/09/2023 EXAM: DUAL X-RAY ABSORPTIOMETRY (DXA) FOR BONE MINERAL DENSITY IMPRESSION: Referring Physician:  Genia Kettering Your patient completed [name] bone mineral density test using GE Lunar iDXA system (analysis version: 16). Technologist: KAT PATIENT: Name: [name], [name] Patient ID: [id number] Birth Date: [birth year] Height: 65.0 in. Sex: Female Measured: 05/09/2023 Weight: 179.0 lbs. Indications: Estrogen Deficient, Gabapentin , Height Loss (781.91), Postmenopausal Fractures: NONE Treatments: Vitamin D  (E933.5) ASSESSMENT: The BMD measured at Femur Neck Left is 0.964 g/cm2 with [name] T-score of -0.5. This patient is considered normal according to World Health Organization [facility]) criteria. The quality of the exam is good. L1, L4 were excluded due to degenerative changes. Site Region Measured Date Measured Age YA BMD Significant CHANGE T-score DualFemur Neck Left  05[age]09[age]2023    68.3         -0.5    0.964 g/cm2 AP Spine  L2-L3      05[age]09/2023    68.3         -0.3    1.168 g/cm2 DualFemur Total Mean 4/09/2023    68.3         0.3     1.040 g/cm2 World Health Organization [facility]) criteria for  post-menopausal, Caucasian Women: Normal       T-score at or above -1 SD Osteopenia   T-score between -1 and -2.5 SD Osteoporosis T-score at or below -2.5 SD RECOMMENDATION: 1. All patients should optimize calcium and vitamin D  intake. 2. Consider FDA-approved medical therapies  in postmenopausal women and men aged 74 years and older, based on the following: [name]. [name] hip or vertebral (clinical or morphometric) fracture. b. T-score = -2.5 at the femoral neck or spine after appropriate evaluation to exclude secondary causes. c. Low bone mass (T-score between -1.0 and -2.5 at the femoral neck or spine) and [name] 10-year probability of [name] hip fracture = 3% or [name] 10-year probability of [name] major osteoporosis-related fracture = 20% based on the [place] -adapted WHO algorithm. d. Clinician judgment and/or patient preferences may indicate treatment for people with 10-year fracture probabilities above or below these levels. FOLLOW-UP: Patients with diagnosis of osteoporosis or at high risk for fracture should have regular bone mineral density tests.? Patients eligible for Medicare are allowed routine testing every 2 years.? The testing frequency can be increased to one year for patients who have rapidly progressing disease, are receiving or discontinuing medical therapy to restore bone mass, or have additional risk factors. I have reviewed this study and agree with the findings. [name]. Electronically Signed   By: Dina  Arceo M.D.   On: 05/09/2023 10:06    Assessment & Plan:   Problem List Items Addressed This Visit     B12 deficiency   Chronic Risks associated with treatment noncompliance were discussed. Compliance was encouraged.      Relevant Orders   Ambulatory referral to Orthopedic Surgery   Vitamin B12   GOUT   No relapse      Relevant Orders   TSH   Urinalysis   CBC with Differential/Platelet   Lipid panel   Comprehensive metabolic panel with GFR   Uric acid   Vitamin D  deficiency    Re-start Vit D Risks associated with treatment noncompliance were discussed. Compliance was encouraged.       Relevant Orders   Ambulatory referral to Orthopedic Surgery   VITAMIN D  25 Hydroxy (Vit-D Deficiency, Fractures)   Trigger finger of right hand - Primary   R middle finger is triggering Splinting discussed Ortho ref      Relevant Orders   Ambulatory referral to Orthopedic Surgery   TSH   Urinalysis   CBC with Differential/Platelet   Lipid panel   Comprehensive metabolic panel with GFR      No orders of the defined types were placed in this encounter.     Follow-up: Return in about 6 months (around 12/22/2023) for Wellness Exam.  Anitra Barn, MD

## 2023-06-21 NOTE — Assessment & Plan Note (Signed)
Chronic Risks associated with treatment noncompliance were discussed. Compliance was encouraged. 

## 2023-06-21 NOTE — Assessment & Plan Note (Signed)
No relapse 

## 2023-08-13 ENCOUNTER — Other Ambulatory Visit: Payer: Self-pay | Admitting: Internal Medicine

## 2023-09-14 ENCOUNTER — Ambulatory Visit
Admission: RE | Admit: 2023-09-14 | Discharge: 2023-09-14 | Disposition: A | Discharge status: Home / Self Care | Payer: MEDICARE | Source: Ambulatory Visit | Attending: Orthopaedic Surgery | Admitting: Orthopaedic Surgery

## 2023-09-14 ENCOUNTER — Other Ambulatory Visit: Payer: Self-pay | Admitting: Orthopaedic Surgery

## 2023-09-14 DIAGNOSIS — M79672 Pain in left foot: Secondary | ICD-10-CM

## 2023-11-28 ENCOUNTER — Other Ambulatory Visit: Payer: MEDICARE

## 2023-11-28 DIAGNOSIS — M65331 Trigger finger, right middle finger: Secondary | ICD-10-CM | POA: Diagnosis not present

## 2023-11-28 DIAGNOSIS — E538 Deficiency of other specified B group vitamins: Secondary | ICD-10-CM | POA: Diagnosis not present

## 2023-11-28 DIAGNOSIS — M1A9XX0 Chronic gout, unspecified, without tophus (tophi): Secondary | ICD-10-CM | POA: Diagnosis not present

## 2023-11-28 DIAGNOSIS — E559 Vitamin D deficiency, unspecified: Secondary | ICD-10-CM

## 2023-11-28 LAB — CBC WITH DIFFERENTIAL/PLATELET
Basophils Absolute: 0.1 K/uL (ref 0.0–0.1)
Basophils Relative: 1.1 % (ref 0.0–3.0)
Eosinophils Absolute: 0.6 K/uL (ref 0.0–0.7)
Eosinophils Relative: 8.3 % — ABNORMAL HIGH (ref 0.0–5.0)
HCT: 41.8 % (ref 36.0–46.0)
Hemoglobin: 14 g/dL (ref 12.0–15.0)
Lymphocytes Relative: 31.9 % (ref 12.0–46.0)
Lymphs Abs: 2.3 K/uL (ref 0.7–4.0)
MCHC: 33.4 g/dL (ref 30.0–36.0)
MCV: 90.5 fl (ref 78.0–100.0)
Monocytes Absolute: 0.7 K/uL (ref 0.1–1.0)
Monocytes Relative: 9.1 % (ref 3.0–12.0)
Neutro Abs: 3.6 K/uL (ref 1.4–7.7)
Neutrophils Relative %: 49.6 % (ref 43.0–77.0)
Platelets: 281 K/uL (ref 150.0–400.0)
RBC: 4.62 Mil/uL (ref 3.87–5.11)
RDW: 12.9 % (ref 11.5–15.5)
WBC: 7.2 K/uL (ref 4.0–10.5)

## 2023-11-28 LAB — COMPREHENSIVE METABOLIC PANEL WITH GFR
ALT: 19 U/L (ref 0–35)
AST: 23 U/L (ref 0–37)
Albumin: 4.5 g/dL (ref 3.5–5.2)
Alkaline Phosphatase: 81 U/L (ref 39–117)
BUN: 10 mg/dL (ref 6–23)
CO2: 23 meq/L (ref 19–32)
Calcium: 9 mg/dL (ref 8.4–10.5)
Chloride: 102 meq/L (ref 96–112)
Creatinine, Ser: 0.77 mg/dL (ref 0.40–1.20)
GFR: 79 mL/min (ref >60.00)
Glucose, Bld: 89 mg/dL (ref 70–99)
Potassium: 3.8 meq/L (ref 3.5–5.1)
Sodium: 136 meq/L (ref 135–145)
Total Bilirubin: 0.5 mg/dL (ref 0.2–1.2)
Total Protein: 7.1 g/dL (ref 6.0–8.3)

## 2023-11-28 LAB — URINALYSIS, ROUTINE W REFLEX MICROSCOPIC
Bilirubin Urine: NEGATIVE
Hgb urine dipstick: NEGATIVE
Ketones, ur: NEGATIVE
Nitrite: NEGATIVE
Specific Gravity, Urine: <=1.005 — AB (ref 1.000–1.030)
Total Protein, Urine: NEGATIVE
Urine Glucose: NEGATIVE
Urobilinogen, UA: 0.2 (ref 0.0–1.0)
pH: 6 (ref 5.0–8.0)

## 2023-11-28 LAB — LIPID PANEL
Cholesterol: 207 mg/dL — ABNORMAL HIGH (ref 0–200)
HDL: 58.1 mg/dL (ref >39.00)
LDL Cholesterol: 102 mg/dL — ABNORMAL HIGH (ref 0–99)
NonHDL: 148.85
Total CHOL/HDL Ratio: 4
Triglycerides: 233 mg/dL — ABNORMAL HIGH (ref 0.0–149.0)
VLDL: 46.6 mg/dL — ABNORMAL HIGH (ref 0.0–40.0)

## 2023-11-28 LAB — VITAMIN B12: Vitamin B-12: 289 pg/mL (ref 211–911)

## 2023-11-28 LAB — TSH: TSH: 4.01 u[IU]/mL (ref 0.35–5.50)

## 2023-11-28 LAB — URIC ACID: Uric Acid, Serum: 5 mg/dL (ref 2.4–7.0)

## 2023-11-28 LAB — VITAMIN D 25 HYDROXY (VIT D DEFICIENCY, FRACTURES): VITD: 34.44 ng/mL (ref 30.00–100.00)

## 2023-12-04 ENCOUNTER — Ambulatory Visit: Payer: Self-pay | Admitting: Internal Medicine

## 2023-12-05 ENCOUNTER — Ambulatory Visit: Payer: Self-pay | Admitting: Internal Medicine

## 2023-12-27 ENCOUNTER — Ambulatory Visit: Payer: MEDICARE | Admitting: Internal Medicine

## 2023-12-27 ENCOUNTER — Encounter: Payer: Self-pay | Admitting: Internal Medicine

## 2023-12-27 VITALS — BP 142/78 | HR 65 | Temp 97.9°F | Ht 67.0 in

## 2023-12-27 DIAGNOSIS — M545 Low back pain, unspecified: Secondary | ICD-10-CM | POA: Diagnosis not present

## 2023-12-27 DIAGNOSIS — Z0000 Encounter for general adult medical examination without abnormal findings: Secondary | ICD-10-CM | POA: Insufficient documentation

## 2023-12-27 DIAGNOSIS — E538 Deficiency of other specified B group vitamins: Secondary | ICD-10-CM

## 2023-12-27 DIAGNOSIS — E559 Vitamin D deficiency, unspecified: Secondary | ICD-10-CM | POA: Diagnosis not present

## 2023-12-27 MED ORDER — CYCLOBENZAPRINE HCL 10 MG PO TABS: 10.0000 mg | ORAL_TABLET | Freq: Three times a day (TID) | ORAL | 1 refills | Status: AC | PRN

## 2023-12-27 NOTE — Assessment & Plan Note (Signed)
On Vit D Risks associated with treatment noncompliance were discussed. Compliance was encouraged.  

## 2023-12-27 NOTE — Assessment & Plan Note (Signed)

## 2023-12-27 NOTE — Assessment & Plan Note (Signed)
Chronic Risks associated with treatment noncompliance were discussed. Compliance was encouraged. 

## 2023-12-27 NOTE — Assessment & Plan Note (Signed)
MSK Flexeril prn

## 2023-12-27 NOTE — Progress Notes (Signed)
 Subjective:  Patient ID: [name], female    DOB: 08-24-1955  Age: 68 y.o. MRN: [id number]  CC: Annual Exam (Annual Exam)   HPI [name] presents for a well exam. C/o LBP F/u on vit def  Outpatient Medications Prior to Visit  Medication Sig Dispense Refill   cyanocobalamin  (VITAMIN B12) 1000 MCG tablet Take 1 tablet (1,000 mcg total) by mouth daily. 100 tablet 3   famotidine  (PEPCID ) 40 MG tablet TAKE 1 TABLET BY MOUTH EVERY DAY 90 tablet 3   pantoprazole  (PROTONIX ) 40 MG tablet TAKE 1 TABLET BY MOUTH TWICE A DAY 180 tablet 3   Vitamin D , Ergocalciferol , (DRISDOL ) 1.25 MG (50000 UNIT) CAPS capsule TAKE 1 CAPSULE BY MOUTH ONCE EVERY MONTH 3 capsule 3   gabapentin  (NEURONTIN ) 300 MG capsule Take 1 capsule (300 mg total) by mouth at bedtime. 90 capsule 3   methocarbamol (ROBAXIN) 750 MG tablet Take 750 mg by mouth every 6 (six) hours as needed.     oxyCODONE  (OXY IR/ROXICODONE ) 5 MG immediate release tablet Take 5 mg by mouth 4 (four) times daily as needed.     promethazine  (PHENERGAN ) 12.5 MG tablet Take 1 tablet (12.5 mg total) by mouth every 4 (four) hours as needed for nausea or vomiting. 30 tablet 1   No facility-administered medications prior to visit.    ROS: Review of Systems  Constitutional:  Positive for unexpected weight change. Negative for activity change, appetite change, chills and fatigue.  HENT:  Negative for congestion, mouth sores and sinus pressure.   Eyes:  Negative for visual disturbance.  Respiratory:  Negative for cough and chest tightness.   Gastrointestinal:  Negative for abdominal pain and nausea.  Genitourinary:  Negative for difficulty urinating, frequency and vaginal pain.  Musculoskeletal:  Positive for arthralgias and back pain. Negative for gait problem.  Skin:  Negative for pallor and rash.  Neurological:  Negative for dizziness, tremors, weakness, numbness and headaches.  Psychiatric/Behavioral:  Negative for confusion, sleep  disturbance and suicidal ideas.     Objective:  BP (!) 142/78   Pulse 65   Temp 97.9 F (36.6 C)   Ht 5' 7 (1.702 m)   SpO2 99%   BMI 26.63 kg/m   BP Readings from Last 3 Encounters:  12/27/23 (!) 142/78  06/21/23 122/88  12/22/22 120/80    Wt Readings from Last 3 Encounters:  05/03/23 170 lb (77.1 kg)  09/14/18 180 lb 6.4 oz (81.8 kg)  10/07/06 144 lb (65.3 kg)    Physical Exam Constitutional:      General: She is not in acute distress.    Appearance: She is well-developed. She is not ill-appearing.  HENT:     Head: Normocephalic.     Right Ear: External ear normal.     Left Ear: External ear normal.     Nose: Nose normal.  Eyes:     General:        Right eye: No discharge.        Left eye: No discharge.     Conjunctiva/sclera: Conjunctivae normal.     Pupils: Pupils are equal, round, and reactive to light.  Neck:     Thyroid : No thyromegaly.     Vascular: No JVD.     Trachea: No tracheal deviation.  Cardiovascular:     Rate and Rhythm: Normal rate and regular rhythm.     Heart sounds: Normal heart sounds.  Pulmonary:     Effort: No respiratory distress.  Breath sounds: No stridor. No wheezing.  Abdominal:     General: Bowel sounds are normal. There is no distension.     Palpations: Abdomen is soft. There is no mass.     Tenderness: There is no abdominal tenderness. There is no guarding or rebound.  Musculoskeletal:        General: No tenderness.     Cervical back: Normal range of motion and neck supple. No rigidity.  Lymphadenopathy:     Cervical: No cervical adenopathy.  Skin:    Findings: No erythema or rash.  Neurological:     Mental Status: She is oriented to person, place, and time.     Cranial Nerves: No cranial nerve deficit.     Motor: No abnormal muscle tone.     Coordination: Coordination normal.     Deep Tendon Reflexes: Reflexes normal.  Psychiatric:        Behavior: Behavior normal.        Thought Content: Thought content normal.         Judgment: Judgment normal.     Lab Results  Component Value Date   WBC 7.2 11/28/2023   HGB 14.0 11/28/2023   HCT 41.8 11/28/2023   PLT 281.0 11/28/2023   GLUCOSE 89 11/28/2023   CHOL 207 (H) 11/28/2023   TRIG 233.0 (H) 11/28/2023   HDL 58.10 11/28/2023   LDLDIRECT 129.0 09/21/2022   LDLCALC 102 (H) 11/28/2023   ALT 19 11/28/2023   AST 23 11/28/2023   NA 136 11/28/2023   K 3.8 11/28/2023   CL 102 11/28/2023   CREATININE 0.77 11/28/2023   BUN 10 11/28/2023   CO2 23 11/28/2023   TSH 4.01 11/28/2023   INR 0.96 07/03/2009   HGBA1C 5.7 07/20/2013    CT FOOT LEFT WO CONTRAST Result Date: 09/14/2023 CLINICAL DATA:  Left foot pain. The patient reportedly underwent foot surgery in September 2024 EXAM: CT OF THE LEFT FOOT WITHOUT CONTRAST TECHNIQUE: Multidetector CT imaging of the left foot was performed according to the standard protocol. Multiplanar CT image reconstructions were also generated. RADIATION DOSE REDUCTION: This exam was performed according to the departmental dose-optimization program which includes automated exposure control, adjustment of the mA and/or kV according to patient size and/or use of iterative reconstruction technique. COMPARISON:  03/27/2022 MRI FINDINGS: Bones/Joint/Cartilage Plate and screw fixator with fusion of the navicular, medial cuneiform, and first metatarsal. Solid bony union between the medial cuneiform and the base of the first metatarsal; irregular residuum of the articulation between the navicular and the medial cuneiform visible. There are also single screws distally in the first and second metatarsal metaphyses. Sclerosis is observed in the navicular and medial cuneiform. Mild tibiotalar articular space narrowing. Degenerative findings between the navicular and the bases of the second and third cuneiforms, at the Lisfranc joint, and at the articulation between the cuboid and lateral cuneiform. This includes loss of disc height and degenerative  subcortical cyst formation as well as some spurring. Large plantar calcaneal spur. Ligaments Suboptimally assessed by CT. Muscles and Tendons Substantially thickened and expanded tibialis anterior tendon presumably from advanced tendinopathy, attachment site obscured by the adjacent plate and screw fixator. Thickened medial band of the plantar fascia, cannot exclude plantar fasciitis. Soft tissues Unremarkable IMPRESSION: 1. Plate and screw fixator with fusion of the navicular, medial cuneiform, and first metatarsal. Solid bony union between the medial cuneiform and the base of the first metatarsal; irregular residuum of the articulation between the navicular and the medial cuneiform visible. 2.  Sclerosis in the navicular and medial cuneiform. 3. Degenerative findings between the navicular and the bases of the second and third cuneiforms, at the Lisfranc joint, and at the articulation between the cuboid and lateral cuneiform. 4. Substantially thickened and expanded tibialis anterior tendon presumably from advanced tendinopathy, attachment site obscured by the adjacent plate and screw fixator. 5. Thickened medial band of the plantar fascia, cannot exclude plantar fasciitis. 6. Large plantar calcaneal spur. Electronically Signed   By: Ryan Salvage M.D.   On: 09/14/2023 11:10    Assessment & Plan:   Problem List Items Addressed This Visit     B12 deficiency   Chronic Risks associated with treatment noncompliance were discussed. Compliance was encouraged.      Low back pain   MSK Flexeril prn      Relevant Medications   cyclobenzaprine (FLEXERIL) 10 MG tablet   Vitamin D  deficiency   On Vit D Risks associated with treatment noncompliance were discussed. Compliance was encouraged.       Well adult exam - Primary    We discussed age appropriate health related issues, including available/recomended screening tests and vaccinations. Labs were ordered to be later reviewed . All questions were  answered. We discussed one or more of the following - seat belt use, use of sunscreen/sun exposure exercise, fall risk reduction, second hand smoke exposure, firearm use and storage, seat belt use, a need for adhering to healthy diet and exercise. Labs were ordered.  All questions were answered.          Meds ordered this encounter  Medications   cyclobenzaprine (FLEXERIL) 10 MG tablet    Sig: Take 1 tablet (10 mg total) by mouth 3 (three) times daily as needed for muscle spasms.    Dispense:  60 tablet    Refill:  1      Follow-up: Return in about 6 months (around 06/25/2024) for a follow-up visit.  Marolyn Noel, MD

## 2023-12-28 ENCOUNTER — Telehealth: Payer: Self-pay

## 2023-12-28 ENCOUNTER — Other Ambulatory Visit (HOSPITAL_COMMUNITY): Payer: Self-pay

## 2023-12-28 NOTE — Telephone Encounter (Signed)
 Pharmacy Patient Advocate Encounter   Received notification from Onbase that prior authorization for Cyclobenzaprine HCl 10MG  tablets   is required/requested.   Insurance verification completed.   The patient is insured through [facility].   Per test claim: PA required; PA submitted to above mentioned insurance via Latent Key/confirmation #/EOC [id number] Status is pending

## 2023-12-29 NOTE — Telephone Encounter (Signed)
 Pharmacy Patient Advocate Encounter  Received notification from [facility] MEDICARE that Prior Authorization for Cyclobenzaprine HCl 10MG  tablets has been CANCELLED due to ARCHIVED BY PHARMACY.. NO PA IS NEEDED    PA #/Case ID/Reference #:  [id number]

## 2024-01-18 ENCOUNTER — Other Ambulatory Visit: Payer: Self-pay | Admitting: Internal Medicine

## 2024-01-24 ENCOUNTER — Encounter: Payer: Self-pay | Admitting: Internal Medicine

## 2024-01-30 ENCOUNTER — Other Ambulatory Visit: Payer: Self-pay | Admitting: Internal Medicine

## 2024-01-30 MED ORDER — GABAPENTIN 400 MG PO CAPS: 400.0000 mg | ORAL_CAPSULE | Freq: Two times a day (BID) | ORAL | 2 refills | Status: AC

## 2024-05-03 ENCOUNTER — Ambulatory Visit

## 2024-05-04 ENCOUNTER — Ambulatory Visit

## 2024-06-25 ENCOUNTER — Ambulatory Visit: Payer: MEDICARE | Admitting: Internal Medicine
